# Patient Record
Sex: Female | Born: 1965 | Race: Black or African American | Hispanic: No | Marital: Married | Smoking: Never smoker
Health system: Southern US, Community
[De-identification: ages and names within clinical notes are randomized; demographics above are authoritative.]

## PROBLEM LIST (undated history)

## (undated) DIAGNOSIS — I10 Essential (primary) hypertension: Secondary | ICD-10-CM

## (undated) DIAGNOSIS — J45909 Unspecified asthma, uncomplicated: Secondary | ICD-10-CM

## (undated) DIAGNOSIS — R011 Cardiac murmur, unspecified: Secondary | ICD-10-CM

## (undated) DIAGNOSIS — E119 Type 2 diabetes mellitus without complications: Secondary | ICD-10-CM

## (undated) DIAGNOSIS — D649 Anemia, unspecified: Secondary | ICD-10-CM

## (undated) DIAGNOSIS — E785 Hyperlipidemia, unspecified: Secondary | ICD-10-CM

## (undated) HISTORY — PX: TUBAL LIGATION: SHX77

## (undated) HISTORY — DX: Essential (primary) hypertension: I10

## (undated) HISTORY — DX: Hyperlipidemia, unspecified: E78.5

## (undated) HISTORY — PX: COLONOSCOPY W/ POLYPECTOMY: SHX1380

## (undated) HISTORY — DX: Type 2 diabetes mellitus without complications: E11.9

## (undated) HISTORY — PX: ANAL FISSURE REPAIR: SHX2312

---

## 2016-10-23 ENCOUNTER — Encounter: Payer: Self-pay | Admitting: *Deleted

## 2016-11-07 ENCOUNTER — Ambulatory Visit: Payer: Self-pay | Admitting: Registered"

## 2016-11-11 NOTE — Progress Notes (Signed)
Cardiology Office Note:    Date:  11/12/2016   ID:  Marisa Anderson, DOB [birth year], MRN [id number]  PCP:  No primary care provider on file.  Cardiologist:  Donato Schultz, MD    Referring MD: Gaynelle Adu, MD     History of Present Illness:    Marisa Anderson is a 51 y.o. female here for preoperative risk evaluation prior to bariatric surgery at the request of Dr. Gaynelle Adu of [facility].  Has a history of morbid obesity, hypertension, diabetes.  Her current BMI is a proximally 42. She denies any significant chest pain, mild shortness of breath with activity noted, no syncope, no bleeding. She remembers being told that she had a cardiac murmur in the past. She works at [employer].  Prior hemoglobin A1c was 7.4. [facility]. Arsenio Loader, NP.  She can easily traverse 2 flights of stairs without anginal symptoms. She is also able to walk a city block without difficulty. Therefore, she is able to complete greater than 4 METS of activity. No prior history of cardiovascular illness, no prior history of stroke. No early family history of CAD.   Past Medical History:  Diagnosis Date  . Diabetes mellitus without complication (HCC)   . Hyperlipidemia   . Hypertension     No past surgical history on file.  Current Medications: Current Meds  Medication Sig  . atorvastatin (LIPITOR) 10 MG tablet Take 10 mg by mouth daily.  . Canagliflozin-Metformin HCl ER (INVOKAMET XR) 150-500 MG TB24 Take by mouth.  . metoprolol (LOPRESSOR) 50 MG tablet Take 50 mg by mouth 2 (two) times daily.  . pioglitazone (ACTOS) 45 MG tablet Take 45 mg by mouth daily.  . valsartan-hydrochlorothiazide (DIOVAN-HCT) 320-25 MG tablet Take 1 tablet by mouth daily.  . [DISCONTINUED] Liraglutide -Weight Management (SAXENDA) 18 MG/3ML SOPN Inject into the skin.     Allergies:   Patient has no known allergies.   Social History   Social History  . Marital status: Married   Spouse name: N/A  . Number of children: N/A  . Years of education: N/A   Social History Main Topics  . Smoking status: Never Smoker  . Smokeless tobacco: Never Used  . Alcohol use Yes     Comment: OCCASIONALLY  . Drug use: No  . Sexual activity: Not on file   Other Topics Concern  . Not on file   Social History Narrative  . No narrative on file     Family History: The patient's family history includes Breast cancer in her other; Diabetes in her father and sister; Hypertension in her brother, daughter, father, mother, and sister; Kidney disease in her father; Thyroid disease in her sister. ROS:   Please see the history of present illness.     All other systems reviewed and are negative.  EKGs/Labs/Other Studies Reviewed:    The following studies were reviewed today: Prior office notes reviewed, EKG reviewed, lab work reviewed  EKG:  EKG is ordered today.  The ekg ordered today demonstrates normal sinus rhythm 73 poor R-wave progression personally viewed, nonspecific T-wave flattening.  Recent Labs: No results found for requested labs within last 8760 hours.   Recent Lipid Panel No results found for: CHOL, TRIG, HDL, CHOLHDL, VLDL, LDLCALC, LDLDIRECT  Physical Exam:    VS:  BP 132/70   Pulse 73   Ht 5\' 4"  (1.626 m)   Wt 256 lb 6 oz (116.3 kg)   SpO2 98%   BMI  44.01 kg/m     Wt Readings from Last 3 Encounters:  11/12/16 256 lb 6 oz (116.3 kg)     GEN:  Well nourished, well developed in no acute distress HEENT: Normal NECK: No JVD; No carotid bruits LYMPHATICS: No lymphadenopathy CARDIAC: RRR, 1/6 systolic murmur right upper sternal border, no rubs, gallops RESPIRATORY:  Clear to auscultation without rales, wheezing or rhonchi  ABDOMEN: Soft, non-tender, non-distended, obese MUSCULOSKELETAL:  No edema; No deformity  SKIN: Warm and dry NEUROLOGIC:  Alert and oriented x 3 PSYCHIATRIC:  Normal affect   ASSESSMENT:    1. Murmur, cardiac   2. Preoperative  cardiovascular examination   3. Morbid obesity (HCC)   4. Diabetes mellitus with coincident hypertension (HCC)    PLAN:    In order of problems listed above:  Preoperative risk stratification prior to gastric bypass surgery  - She has risk factors of hypertension, hyperlipidemia, diabetes. She is not complaining of any anginal symptoms. She is able to complete greater than 4 METS of activity without difficulty. Based upon [facility] guidelines, she may proceed with intermediate risk surgery without any further testing.  Heart murmur  - 1/6 systolic murmur heard at right upper sternal border. Possible flow murmur. We will check echocardiogram. If something drastically changes, we will let Dr. Andrey CampanileWilson know.  Diabetes with hypertension  -Per primary team.. Doing well.[place]  Morbid obesity  - Wishes to pursue bariatric surgery. She will first go to the [place] then [place] in October. After this, she will hopefully undergo surgery. She has been battling with weight for quite some time.   Medication Adjustments/Labs and Tests Ordered: Current medicines are reviewed at length with the patient today.  Concerns regarding medicines are outlined above. Labs and tests ordered and medication changes are outlined in the patient instructions below:  Patient Instructions  Medication Instructions:  The current medical regimen is effective;  continue present plan and medications.  Testing/Procedures: Your physician has requested that you have an echocardiogram. Echocardiography is a painless test that uses sound waves to create images of your heart. It provides your doctor with information about the size and shape of your heart and how well your heart's chambers and valves are working. This procedure takes approximately one hour. There are no restrictions for this procedure.  Follow-Up: Follow up as needed with Dr Anne FuSkains.  You have been cleared for  surgery.  Thank you for choosing [facility]!!        Signed, Donato SchultzMark Delmont Prosch, MD  11/12/2016 11:17 AM    [facility]

## 2016-11-12 ENCOUNTER — Encounter (INDEPENDENT_AMBULATORY_CARE_PROVIDER_SITE_OTHER): Payer: Self-pay

## 2016-11-12 ENCOUNTER — Ambulatory Visit (INDEPENDENT_AMBULATORY_CARE_PROVIDER_SITE_OTHER): Payer: BLUE CROSS/BLUE SHIELD | Admitting: Cardiology

## 2016-11-12 ENCOUNTER — Encounter: Payer: Self-pay | Admitting: *Deleted

## 2016-11-12 ENCOUNTER — Encounter: Payer: Self-pay | Admitting: Cardiology

## 2016-11-12 VITALS — BP 132/70 | HR 73 | Ht 64.0 in | Wt 256.4 lb

## 2016-11-12 DIAGNOSIS — Z01810 Encounter for preprocedural cardiovascular examination: Secondary | ICD-10-CM

## 2016-11-12 DIAGNOSIS — E119 Type 2 diabetes mellitus without complications: Secondary | ICD-10-CM | POA: Insufficient documentation

## 2016-11-12 DIAGNOSIS — R011 Cardiac murmur, unspecified: Secondary | ICD-10-CM | POA: Insufficient documentation

## 2016-11-12 DIAGNOSIS — I10 Essential (primary) hypertension: Secondary | ICD-10-CM | POA: Diagnosis not present

## 2016-11-12 NOTE — Patient Instructions (Signed)
Medication Instructions:  The current medical regimen is effective;  continue present plan and medications.  Testing/Procedures: Your physician has requested that you have an echocardiogram. Echocardiography is a painless test that uses sound waves to create images of your heart. It provides your doctor with information about the size and shape of your heart and how well your heart's chambers and valves are working. This procedure takes approximately one hour. There are no restrictions for this procedure.  Follow-Up: Follow up as needed with Dr Anne FuSkains.  You have been cleared for surgery.  Thank you for choosing [facility]!!

## 2016-11-21 ENCOUNTER — Encounter: Payer: Self-pay | Admitting: Registered"

## 2016-11-21 ENCOUNTER — Encounter: Payer: BLUE CROSS/BLUE SHIELD | Attending: General Surgery | Admitting: Registered"

## 2016-11-21 DIAGNOSIS — Z713 Dietary counseling and surveillance: Secondary | ICD-10-CM | POA: Insufficient documentation

## 2016-11-21 DIAGNOSIS — E119 Type 2 diabetes mellitus without complications: Secondary | ICD-10-CM

## 2016-11-21 NOTE — Progress Notes (Signed)
Pre-Op Assessment Visit:  Pre-Operative RYGB Surgery  Medical Nutrition Therapy:  Appt start time: 10:45  End time:  11:42  Patient was seen on 11/21/2016 for Pre-Operative Nutrition Assessment. Assessment and letter of approval faxed to [facility] coordinator on 11/21/2016.   Pt expectation of surgery: rid of type 2 diabetes, to get healthy  Pt expectation of Dietitian: guidance on what needs to be done to be healthy  Start weight at NDES: 257.9 BMI: 43.58   Pt states she will probably have surgery after Oct 2018. Pt states she eats a lot of fast food and she wokrs 12 hrs shifts. Pt states she checks her blood sugar 2x/day about 5 days/week: FBS (70-80) after meals (180). Pt states she's had diabetes about 16 yrs. Pt states her last  A1c (7.4) about 5 months ago and has to go every 6 months to have it checked.   Per insurance, pt needs 6 SWL visits prior to surgery.   24 hr Dietary Recall: First Meal: cereal  Snack: grapes Second Meal: [employer]ee[employer]-hot dog, fries Snack: apple, chips Third Meal: [employer]ee[employer]-Burger, chips  Snack: sometimes fruit Beverages: sweet tea, water, soda, juice  Encouraged to engage in 150 minutes of moderate physical activity including cardiovascular and weight baring weekly  Handouts given during visit include:  . Pre-Op Goals . Bariatric Surgery Protein Shakes . Vitamin and Mineral Recommendations  During the appointment today the following Pre-Op Goals were reviewed with the patient: . Maintain or lose weight as instructed by your surgeon . Make healthy food choices . Begin to limit portion sizes . Limited concentrated sugars and fried foods . Keep fat/sugar in the single digits per serving on          food labels . Practice CHEWING your food  (aim for 30 chews per bite or until applesauce consistency) . Practice not drinking 15 minutes before, during, and 30 minutes after each meal/snack . Avoid all carbonated  beverages  . Avoid/limit caffeinated beverages  . Avoid all sugar-sweetened beverages . Consume 3 meals per day; eat every 3-5 hours . Make a list of non-food related activities . Aim for 64-100 ounces of FLUID daily  . Aim for at least 60-80 grams of PROTEIN daily . Look for a liquid protein source that contain ?15 g protein and ?5 g carbohydrate  (ex: shakes, drinks, shots) . Physical activity is an important part of a healthy lifestyle so keep it moving!  Goals: - Add in protein source during snack time with fruit. - Check blood sugar 3-4 times per day and track numbers. Fasting (80-130) and 2 hrs after meals (less than 180).   Follow diet recommendations listed below Energy and Macronutrient Recommendations: Calories: 1600 Carbohydrate: 180 Protein: 120 Fat: 44  Demonstrated degree of understanding via:  Teach Back   Teaching Method Utilized:  Visual Auditory Hands on  Barriers to learning/adherence to lifestyle change: work-life balance  Patient to call the Nutrition and Diabetes Education Services to enroll in Pre-Op and Post-Op Nutrition Education when surgery date is scheduled.

## 2016-11-21 NOTE — Patient Instructions (Addendum)
-   Add in protein source during snack time with fruit.  - Check blood sugar 3-4 times per day and track numbers. Fasting (80-130) and 2 hrs after meals (less than 180).

## 2016-11-27 ENCOUNTER — Other Ambulatory Visit: Payer: Self-pay

## 2016-11-27 ENCOUNTER — Ambulatory Visit (HOSPITAL_COMMUNITY): Payer: BLUE CROSS/BLUE SHIELD | Attending: Cardiovascular Disease

## 2016-11-27 DIAGNOSIS — R011 Cardiac murmur, unspecified: Secondary | ICD-10-CM | POA: Diagnosis not present

## 2016-11-27 DIAGNOSIS — I517 Cardiomegaly: Secondary | ICD-10-CM | POA: Diagnosis not present

## 2016-11-27 LAB — ECHOCARDIOGRAM COMPLETE
AVLVOTPG: 6 mmHg
Ao-asc: 32 cm
CHL CUP MV DEC (S): 239
CHL CUP STROKE VOLUME: 50 mL
CHL CUP TV REG PEAK VELOCITY: 276 cm/s
E decel time: 239 msec
E/e' ratio: 7.03
FS: 48 % — AB (ref 28–44)
IVS/LV PW RATIO, ED: 1.21
LA diam end sys: 36 mm
LA diam index: 1.64 cm/m2
LA vol A4C: 34 ml
LA vol index: 21.5 mL/m2
LA vol: 47 mL
LASIZE: 36 mm
LDCA: 3.14 cm2
LV TDI E'LATERAL: 12.5
LV sys vol index: 12 mL/m2
LV sys vol: 26 mL (ref 14–42)
LVDIAVOL: 76 mL (ref 46–106)
LVDIAVOLIN: 35 mL/m2
LVEEAVG: 7.03
LVEEMED: 7.03
LVELAT: 12.5 cm/s
LVOT SV: 87 mL
LVOT VTI: 27.7 cm
LVOT diameter: 20 mm
LVOTPV: 120 cm/s
Lateral S' vel: 16.1 cm/s
MV pk E vel: 87.9 m/s
MVPG: 3 mmHg
MVPKAVEL: 62.7 m/s
PW: 12 mm — AB (ref 0.6–1.1)
RV sys press: 33 mmHg
Simpson's disk: 66
TDI e' medial: 9.65
TR max vel: 276 cm/s

## 2016-11-28 ENCOUNTER — Other Ambulatory Visit (HOSPITAL_COMMUNITY): Payer: Self-pay

## 2016-11-29 ENCOUNTER — Telehealth: Payer: Self-pay | Admitting: Cardiology

## 2016-11-29 NOTE — Telephone Encounter (Signed)
Follow Up ° ° °Pt is calling following up on results. Please call. °

## 2016-11-29 NOTE — Telephone Encounter (Signed)
Lm to cb for results

## 2016-12-03 NOTE — Telephone Encounter (Signed)
Reviewed result of echo with patient who thanked me for the call.

## 2016-12-19 ENCOUNTER — Encounter: Payer: BLUE CROSS/BLUE SHIELD | Attending: General Surgery | Admitting: Registered"

## 2016-12-19 ENCOUNTER — Encounter: Payer: Self-pay | Admitting: Registered"

## 2016-12-19 DIAGNOSIS — E119 Type 2 diabetes mellitus without complications: Secondary | ICD-10-CM

## 2016-12-19 DIAGNOSIS — Z713 Dietary counseling and surveillance: Secondary | ICD-10-CM | POA: Diagnosis not present

## 2016-12-19 NOTE — Patient Instructions (Addendum)
-   Check 3-4 times per day: fasting blood sugar when you initially wake up and 2 hours after meals.   - Eat snack or meal to maintain blood sugar after recovering from low blood sugar episode.  - Add in meal during work shift at night.   - Continue to snack at work. Pair fruit with protein source: apple or banana with peanut butter or protein shake  - Make healthier choices choosing baked, grilled, steamed, broiled options.

## 2016-12-19 NOTE — Progress Notes (Signed)
Appt start time: 9:50 end time: 10:17  Assessment: 1st SWL Appointment.   Start Wt at NDES: 257.9 Wt: 257.3 BMI: 43.48   Pt is talkative. Pt arrives having maintained weight from previous visit. Pt states she has reduced soda intake but still drinks 1 at work sometimes. Pt states her BS drops to 80-90 and feels bad. Pt states she doesn't experience typical signs and symptoms of hypoglycemia; only "feels funny". Pt states she works 7pm-7am and doesn't eat a meal while working due to it being really hot at work. Pt states she doesn't check BS every day; checks 10-12x/wk. Pt states FBS (70-80). RD educated pt on importance of checking BS multiple times/day. Pt reports she will be going to [place] in Aug with family/friends. Pt states daughter is vegetarian and prepares meals for her at times to help out.    MEDICATIONS: See list   DIETARY INTAKE:  24-hr recall:  B ( AM): fried chicken, green beans, corn or [employer]-1/2 wrap  Snk ( AM): nabs, fruit, chips (sometimes)  L ( PM): 1/2 wrap  Snk ( PM): none D ( PM): Bojnagles-chicken supremes, sm amt of fries or scrambled eggs, 3 pieces of bacon, bun or grilled [place]aw[place]key wing, beef link sausage, veggie baked beans Snk ( PM): non Beverages: sweet tea  Usual physical activity: none  Diet to Follow: Calories: 1600 kcals Carbohydrate: 180g Protein: 120g Fat: 44g   Preferred Learning Style:   No preference indicated   Learning Readiness:   Contemplating  Ready  Change in progress     Nutritional Diagnosis:  [place]-3.3 Overweight/obesity related to past poor dietary habits and physical inactivity as evidenced by patient w/ planned RYGB surgery following dietary guidelines for continued weight loss.    Intervention:  Nutrition counseling for upcoming Bariatric Surgery.  Goals:  - Check 3-4 times per day: fasting blood sugar when you initially wake up and 2 hours after meals.  - Eat snack or meal to maintain blood sugar after  recovering from low blood sugar episode. - Add in meal during work shift at night.  - Continue to snack at work. Pair fruit with protein source: apple or banana with peanut butter or protein shake - Make healthier choices choosing baked, grilled, steamed, broiled options.  Teaching Method Utilized:  Visual Auditory Hands on  Handouts given during visit include:  Bake, Broil, Grill  Vitamin and Mineral Recommendations  Barriers to learning/adherence to lifestyle change: work-life balance  Demonstrated degree of understanding via:  Teach Back   Monitoring/Evaluation:  Dietary intake, exercise, and body weight in 1 month(s).

## 2017-01-16 ENCOUNTER — Encounter: Payer: BLUE CROSS/BLUE SHIELD | Attending: General Surgery | Admitting: Registered"

## 2017-01-16 DIAGNOSIS — E119 Type 2 diabetes mellitus without complications: Secondary | ICD-10-CM

## 2017-01-16 DIAGNOSIS — Z713 Dietary counseling and surveillance: Secondary | ICD-10-CM | POA: Diagnosis present

## 2017-01-16 NOTE — Patient Instructions (Addendum)
-   Increase non-starchy vegetables to daily routine such as broccoli, cabbage, spinach, collard greens, etc.   - Check blood sugar 3-4 times per day: fasting blood sugar when you initially wake up and 2 hours after meals.   - Add in meal during work shift at night such as peanut butter crackers, fresh vegetables (broccoli, carrots, peppers, tomatoes)  - Substitute flavor packs with water instead of drinking sweet tea and soda.   - Continue to work on chewing well; at least 30 times per bite or applesauce consistency.

## 2017-01-16 NOTE — Progress Notes (Signed)
Appt start time: 11:00 end time: 11:26  Assessment: 2nd SWL Appointment.   Start Wt at NDES: 257.9 Wt: 259.4 BMI: 44.1   Pt is talkative. Pt arrives having gained 2.1 lbs from previous visit. Pt states "something is going on with kidneys". Pt reports she is no longer taking HCT/fluid pills which is causing her to experience swelling in legs. Pt states she does not check BS every day; checks once/day. Pt states FBS (80-90). RD educated pt on importance of checking BS multiple times/day. Pt states she will be going to [place] with friends in a week.   Pt states her BS drops to 80-90 and feels bad. Pt states she doesn't experience typical signs and symptoms of hypoglycemia; only "feels funny". Pt states she works 7pm-7am and doesn't eat a meal while working due to it being really hot at work. Pt reports she will be going to [place] in Aug with family/friends. Pt states daughter is vegetarian and prepares meals for her at times to help out.     MEDICATIONS: See list   DIETARY INTAKE:  24-hr recall:  B ( AM): baked spaghetti, corn  Snk ( AM): grapes, cherries  L ( PM):  peanut butter nabs Snk ( PM): none  D ( PM): McDonalds-big mac combo  Snk ( PM): a few cheese doodles Beverages: sweet tea, water, mountain dew  Usual physical activity: none  Diet to Follow: Calories: 1600 kcals Carbohydrate: 180g Protein: 120g Fat: 44g   Preferred Learning Style:   No preference indicated   Learning Readiness:   Contemplating  Ready  Change in progress     Nutritional Diagnosis:  [place]-3.3 Overweight/obesity related to past poor dietary habits and physical inactivity as evidenced by patient w/ planned RYGB surgery following dietary guidelines for continued weight loss.    Intervention:  Nutrition counseling for upcoming Bariatric Surgery.  Goals:  - Check blood sugar 3-4 times per day: fasting blood sugar when you initially wake up and 2 hours after meals. (in  progress) - Eat snack or meal to maintain blood sugar after recovering from low blood sugar episode. - Add in meal during work shift at night. (in progress) - Continue to snack at work. Pair fruit with protein source: apple or banana with peanut butter or protein shake - Make healthier choices choosing baked, grilled, steamed, broiled options. (in progress) - Increase non-starchy vegetables to daily routine such as broccoli, cabbage, spinach, collard greens, etc. (in progress) - Add in meal during work shift at night such as peanut butter crackers, fresh vegetables (broccoli, carrots, peppers, tomatoes) - Substitute flavor packs with water instead of drinking sweet tea and soda.  - Continue to work on chewing well; at least 30 times per bite or applesauce consistency. (in progress)  Teaching Method Utilized:  Visual Auditory Hands on  Handouts given during visit include:  none  Barriers to learning/adherence to lifestyle change: work-life balance  Demonstrated degree of understanding via:  Teach Back   Monitoring/Evaluation:  Dietary intake, exercise, and body weight in 1 month(s).

## 2017-02-13 ENCOUNTER — Ambulatory Visit: Payer: BLUE CROSS/BLUE SHIELD | Admitting: Registered"

## 2017-02-17 ENCOUNTER — Encounter: Payer: Self-pay | Admitting: Registered"

## 2017-02-17 ENCOUNTER — Encounter: Payer: BLUE CROSS/BLUE SHIELD | Attending: General Surgery | Admitting: Registered"

## 2017-02-17 DIAGNOSIS — Z713 Dietary counseling and surveillance: Secondary | ICD-10-CM | POA: Diagnosis not present

## 2017-02-17 DIAGNOSIS — E119 Type 2 diabetes mellitus without complications: Secondary | ICD-10-CM

## 2017-02-17 NOTE — Patient Instructions (Addendum)
-   Aim to decrease soda intake to 2 sodas per week.   - Increase physical activity to at least 30 min/day, 3 days/week such as walking in neighborhood on non-work days.

## 2017-02-17 NOTE — Progress Notes (Signed)
Appt start time: 2:30 end time: 2:45  Assessment: 3rd SWL Appointment.   Start Wt at NDES: 257.9 Wt: 259.6 BMI: 43.87   Pt is talkative. Pt arrives having gained 2.1 lbs from previous visit. Pt states her recent A1c from last week was 7.0, decreasing from previous value of 7.4. Pt states she checks her BS 2x/day: FBS (111) and evenings (130). Pt states she has been working on chewing well, has reduced soda intake, and replacing with flavor packs in water. Pt states she is drinking about 4 sodas/week. Pt states she is traveling to [place] in Oct for work.    MEDICATIONS: See list   DIETARY INTAKE:  24-hr recall:  B ( AM): oatmeal, water   Snk ( AM): grapes, cherries  L ( PM): Mayflower-catfish, shrimp, baked potato  Snk ( PM): none  D ( PM): Mayflower-catfish, shrimp, baked potato  Snk ( PM): apple, pork skins Beverages: sweet tea, water, mountain dew  Usual physical activity: none  Diet to Follow: Calories: 1600 kcals Carbohydrate: 180g Protein: 120g Fat: 44g   Preferred Learning Style:   No preference indicated   Learning Readiness:   Contemplating  Ready  Change in progress     Nutritional Diagnosis:  [place]-3.3 Overweight/obesity related to past poor dietary habits and physical inactivity as evidenced by patient w/ planned RYGB surgery following dietary guidelines for continued weight loss.    Intervention:  Nutrition counseling for upcoming Bariatric Surgery.  Goals:  - Aim to decrease soda intake to 2 sodas per week.  - Increase physical activity to at least 30 min/day, 3 days/week such as walking in neighborhood on non-work  days.   Teaching Method Utilized:  Visual Auditory Hands on  Handouts given during visit include:  none  Barriers to learning/adherence to lifestyle change: work-life balance  Demonstrated degree of understanding via:  Teach Back   Monitoring/Evaluation:  Dietary intake, exercise, and body weight in 1 month(s).

## 2017-03-18 ENCOUNTER — Encounter: Payer: Self-pay | Admitting: Registered"

## 2017-03-18 ENCOUNTER — Encounter: Payer: BLUE CROSS/BLUE SHIELD | Attending: General Surgery | Admitting: Registered"

## 2017-03-18 DIAGNOSIS — E119 Type 2 diabetes mellitus without complications: Secondary | ICD-10-CM

## 2017-03-18 DIAGNOSIS — Z713 Dietary counseling and surveillance: Secondary | ICD-10-CM | POA: Diagnosis present

## 2017-03-18 NOTE — Progress Notes (Signed)
Appt start time: 11:20 end time: 11:35  Assessment: 4th SWL Appointment.   Start Wt at NDES: 257.9 Wt: 256.7 BMI: 43.42   Pt is talkative. Pt arrives having lost 2.9 lbs from previous visit. Pt states she has reduced soda intake from 4 sodas/week. Pt reports she started off doing well with eliminating sodas for 2 consecutive weeks and then started drinking them again, about 2-3 sodas/week. Pt states Pt states her recent A1c from a month ago was 7.0, a decrease from previous value of 7.4. Pt states she checks her BS 2x/day: FBS (90s) and evenings (130-140). Pt states she does a lot of walking at work. Pt reports she will be traveling to [place] next month for work (10/14-10/18). Pt states she still drinks sweet tea sometimes from [employer].    MEDICATIONS: See list   DIETARY INTAKE:  24-hr recall:  B ( AM): McDonalds- oatmeal, water   Snk ( AM): sometimes grapes, cherries  L ( PM): spam sandwich on wheat bread Snk ( PM): none  D ( PM): baked chicken, cabbage  Snk ( PM): sometimes apple, pork skins Beverages: sweet tea (sometimes), water, mountain dew (sometimes)  Usual physical activity: walking at work  Diet to Follow: Calories: 1600 kcals Carbohydrate: 180g Protein: 120g Fat: 44g   Preferred Learning Style:   No preference indicated   Learning Readiness:   Contemplating  Ready  Change in progress     Nutritional Diagnosis:  [place]-3.3 Overweight/obesity related to past poor dietary habits and physical inactivity as evidenced by patient w/ planned RYGB surgery following dietary guidelines for continued weight loss.    Intervention:  Nutrition counseling for upcoming Bariatric Surgery.  Goals:  - Try MIO drops from [employer], [employer], or [facility]; sweet tea flavor as sweet tea alternative.  - Aim to increase physical activity to at least 30 min/day, 3 days/week such as walking in neighborhood on non-work days.  - Aim to increase non-starchy vegetables such as  cucumbers, tomatoes, peppers, carrots, celery, etc. Mixture of tomatoes, cucumbers, onions and vinegar is a great option. - Aim to decrease soda intake to 2 sodas per week.   - Get Vitamin D.  Teaching Method Utilized:  Visual Auditory Hands on  Handouts given during visit include:  none  Barriers to learning/adherence to lifestyle change: work-life balance  Demonstrated degree of understanding via:  Teach Back   Monitoring/Evaluation:  Dietary intake, exercise, and body weight in 1 month(s).

## 2017-03-18 NOTE — Patient Instructions (Addendum)
-   Try MIO drops from [employer], [employer], or [employer]; sweet tea flavor as sweet tea alternative.   - Aim to increase physical activity to at least 30 min/day, 3 days/week such as walking in neighborhood on non-work days.   - Aim to increase non-starchy vegetables such as cucumbers, tomatoes, peppers, carrots, celery, etc. Mixture of tomatoes, cucumbers, onions and vinegar is a great option.  - Aim to decrease soda intake to 2 sodas per week.    - Get Vitamin D.

## 2017-03-21 ENCOUNTER — Other Ambulatory Visit (HOSPITAL_COMMUNITY): Payer: Self-pay | Admitting: General Surgery

## 2017-04-09 ENCOUNTER — Ambulatory Visit (HOSPITAL_COMMUNITY): Payer: BLUE CROSS/BLUE SHIELD

## 2017-04-09 ENCOUNTER — Encounter (HOSPITAL_COMMUNITY): Payer: Self-pay

## 2017-04-18 ENCOUNTER — Ambulatory Visit: Payer: BLUE CROSS/BLUE SHIELD | Admitting: Registered"

## 2017-04-18 ENCOUNTER — Encounter: Payer: BLUE CROSS/BLUE SHIELD | Admitting: Registered"

## 2017-04-21 ENCOUNTER — Encounter: Payer: Self-pay | Admitting: Registered"

## 2017-04-21 ENCOUNTER — Encounter: Payer: BLUE CROSS/BLUE SHIELD | Attending: General Surgery | Admitting: Registered"

## 2017-04-21 DIAGNOSIS — E119 Type 2 diabetes mellitus without complications: Secondary | ICD-10-CM

## 2017-04-21 DIAGNOSIS — Z713 Dietary counseling and surveillance: Secondary | ICD-10-CM | POA: Diagnosis not present

## 2017-04-21 NOTE — Progress Notes (Signed)
Appt start time: 9:06 end time: 9:24  Assessment: 5th SWL Appointment.   Start Wt at [facility]: 257.9 Wt: 252.3 BMI: 42.64   Pt is talkative. Pt arrives having lost 4.4 lbs from previous visit. Pt states she did a lot of walking during her trip to [place], but has been unable to establish a physical activity routine on her non-work days. Pt states she does not have surgery date yet. Pt states she checks her BS 2x/day: FBS (80) and evenings (128). Pt states she substitutes sweet tea consumption with water and flavor packs. Pt states she has been more intentional with eating more throughout her day, getting at least 3 meals a day. Pt states she wants to have surgery towards the end of November or beginning of December.    MEDICATIONS: See list   DIETARY INTAKE:  24-hr recall:  B ( AM): [employer]-oatmeal, water   Snk ( AM): sometimes grapes, cherries  L ( PM): taco salad Snk ( PM): none  D ( PM): meatloaf, mashed potatoes, green beans Snk ( PM): sometimes apple, pork skins Beverages: sweet tea (sometimes), water, mountain dew (sometimes)  Usual physical activity: walking at work  Diet to Follow: Calories: 1600 kcals Carbohydrate: 180g Protein: 120g Fat: 44g   Preferred Learning Style:   No preference indicated   Learning Readiness:   Contemplating  Ready  Change in progress     Nutritional Diagnosis:  [place]-3.3 Overweight/obesity related to past poor dietary habits and physical inactivity as evidenced by patient w/ planned RYGB surgery following dietary guidelines for continued weight loss.    Intervention:  Nutrition counseling for upcoming Bariatric Surgery.  Goals:  - Aim to walk in neighborhood 20 min, 2 days a week on off-days.  - Add protein options such eggs or Malaw[place]urkey sausage with oatmeal as breakfast.  - Get Vitamin D.  Teaching Method Utilized:  Visual Auditory Hands on  Handouts given during visit include:  none  Barriers to learning/adherence to  lifestyle change: work-life balance  Demonstrated degree of understanding via:  Teach Back   Monitoring/Evaluation:  Dietary intake, exercise, and body weight in 1 month(s).

## 2017-04-21 NOTE — Patient Instructions (Addendum)
-   Aim to walk in neighborhood 20 min, 2 days a week on off-days.   - Add protein options such eggs or Malawiturkey sausage with oatmeal as breakfast.   - Get Vitamin D.

## 2017-05-13 ENCOUNTER — Ambulatory Visit (HOSPITAL_COMMUNITY)
Admission: RE | Admit: 2017-05-13 | Discharge: 2017-05-13 | Disposition: A | Discharge status: Home / Self Care | Payer: BLUE CROSS/BLUE SHIELD | Source: Ambulatory Visit | Attending: General Surgery | Admitting: General Surgery

## 2017-05-13 ENCOUNTER — Other Ambulatory Visit: Payer: Self-pay

## 2017-05-13 DIAGNOSIS — Z01810 Encounter for preprocedural cardiovascular examination: Secondary | ICD-10-CM | POA: Insufficient documentation

## 2017-05-13 DIAGNOSIS — R9431 Abnormal electrocardiogram [ECG] [EKG]: Secondary | ICD-10-CM | POA: Diagnosis not present

## 2017-05-13 DIAGNOSIS — K219 Gastro-esophageal reflux disease without esophagitis: Secondary | ICD-10-CM | POA: Insufficient documentation

## 2017-05-19 ENCOUNTER — Encounter: Payer: Self-pay | Admitting: Skilled Nursing Facility1

## 2017-05-19 ENCOUNTER — Encounter: Payer: BLUE CROSS/BLUE SHIELD | Attending: General Surgery | Admitting: Skilled Nursing Facility1

## 2017-05-19 DIAGNOSIS — Z713 Dietary counseling and surveillance: Secondary | ICD-10-CM | POA: Insufficient documentation

## 2017-05-19 DIAGNOSIS — I10 Essential (primary) hypertension: Secondary | ICD-10-CM

## 2017-05-19 DIAGNOSIS — E119 Type 2 diabetes mellitus without complications: Secondary | ICD-10-CM

## 2017-05-19 NOTE — Patient Instructions (Signed)
-  Try the nuts in your oatmeal

## 2017-05-19 NOTE — Progress Notes (Signed)
Appt start time: 9:06 end time: 9:24  Assessment: 5th SWL Appointment.   Start Wt at [facility]: 257.9 Wt: 257.3 BMI: 43.48  Pt arrives having gained about 5 pounds. Pt states her A1C is 6.9. Pt states she checks her BS 2x/day: FBS (80) and evenings (128). Pt states she has not been working on the items discusses in her last apportionment. Pt states the changes have been hard due to her 12 hour shifts.    MEDICATIONS: See list   DIETARY INTAKE:  24-hr recall:  B ( AM): 3 strips and bacon 2 eggs and cheese and 1 wheat toast Snk ( AM): chips L ( PM): skipped Snk ( PM): none  D ( PM): hardees chicken  Mashed potatoes and green beans Snk ( PM): sometimes apple, pork skins Beverages: sweet tea (sometimes), water, mountain dew (sometimes)  Usual physical activity: walking at work  Diet to Follow: Calories: 1600 kcals Carbohydrate: 180g Protein: 120g Fat: 44g   Preferred Learning Style:   No preference indicated   Learning Readiness:   Contemplating  Ready  Change in progress    Nutritional Diagnosis:  [place]-3.3 Overweight/obesity related to past poor dietary habits and physical inactivity as evidenced by patient w/ planned RYGB surgery following dietary guidelines for continued weight loss.    Intervention:  Nutrition counseling for upcoming Bariatric Surgery.  Goals:  - Aim to walk in neighborhood 20 min, 2 days a week on off-days.  - Add protein options such eggs or Malawiturkey sausage with oatmeal as breakfast.  - Get Vitamin D.  Teaching Method Utilized:  Visual Auditory Hands on  Handouts given during visit include:  none  Barriers to learning/adherence to lifestyle change: work-life balance  Demonstrated degree of understanding via:  Teach Back   Monitoring/Evaluation:  Dietary intake, exercise, and body weight in 1 month(s).

## 2017-06-16 ENCOUNTER — Encounter: Payer: BLUE CROSS/BLUE SHIELD | Attending: General Surgery | Admitting: Registered"

## 2017-06-16 DIAGNOSIS — Z713 Dietary counseling and surveillance: Secondary | ICD-10-CM | POA: Insufficient documentation

## 2017-06-16 DIAGNOSIS — E119 Type 2 diabetes mellitus without complications: Secondary | ICD-10-CM

## 2017-06-16 NOTE — Progress Notes (Signed)
  Pre-Operative Nutrition Class:  Appt start time: 8:15   End time:  9:15.  Patient was seen on 06/16/2017 for Pre-Operative Bariatric Surgery Education at [facility].   Surgery date: TBD Surgery type: RYGB Start weight at [facility]: 257.9 Weight today: 254.5   Samples given per MNT protocol. Patient educated on appropriate usage: Bariatric Advantage Multivitamin Lot # [id number] Exp: 12/2017  Bariatric Advantage Calcium Citrate Lot # [id number] Exp:04/03/2018  Bariatric Advantage Calcium Citrate Lot # [id number] Exp: 03/13/2018  Unjury Protein Powder Lot # [id number] Exp: 11/23/2017   The following the learning objectives were met by the patient during this course:  Identify Pre-Op Dietary Goals and will begin 2 weeks pre-operatively  Identify appropriate sources of fluids and proteins   State protein recommendations and appropriate sources pre and post-operatively  Identify Post-Operative Dietary Goals and will follow for 2 weeks post-operatively  Identify appropriate multivitamin and calcium sources  Describe the need for physical activity post-operatively and will follow MD recommendations  State when to call healthcare provider regarding medication questions or post-operative complications  Handouts given during class include:  Pre-Op Bariatric Surgery Diet Handout  Protein Shake Handout  Post-Op Bariatric Surgery Nutrition Handout  BELT Program Information Flyer  Support Group Information Flyer  [facility] Bariatric Supplements Price List  Follow-Up Plan: Patient will follow-up at [facility] 2 weeks post operatively for diet advancement per MD.

## 2017-11-13 ENCOUNTER — Ambulatory Visit: Payer: Self-pay | Admitting: General Surgery

## 2017-11-13 NOTE — H&P (Signed)
[facility] Documented: 11/11/2017 3:21 PM Location: [facility] Patient #: [id number] DOB: 09/15/1965 Married / Language: English / Race: Black or African American Female  History of Present Illness ([facility] M. Marisa Hehl MD; 11/13/2017 8:24 PM) The patient is a 51 year old female who presents for a bariatric surgery evaluation. She comes in today for up to date preoperative consultation. I initially saw her in April 2018 but again most recently in March 2019. Her weight in 2018 was 256 pounds. She states that she is doing well. She denies any significant medical changes since I saw her 2 mo ago. She has completed 6 months of supervised weight loss with our medical dietitians. She has been evaluated and felt safe for surgery by psychology. Her upper GI was unremarkable except for some mild reflux. Her chest x-ray was unremarkable. She saw cardiology last spring for evaluation for clearance for surgery. She was felt to be low risk.  She had blood work done on September 25, 2017. She had some mild anemia with a hemoglobin of 10.5 and a hematocrit of 35.9. MCV was low at 75.9. Platelet count normal. She had a normal comprehensive metabolic panel with normal BUN and creatinine. Her alkaline phosphatase is slightly elevated at 123. Her hemoglobin A1c was 7.5. Total cholesterol 127, triglycerides, 73; HDL 52; LDL 60. Iron was high at 189, transferrin normal at 3 of 3, TIBC 424. Transferrin saturation 45%. Vitamin B1 normal. Vitamin B12 normal. Calcitrol normal at 67; vitamin D 25-hydroxy low at 10. TSH normal  She denies any chest pain, chest pressure, shortness of breath, orthopnea, paroxysmal nocturnal dyspnea, peripheral edema or personal or family history of blood clots.  09/2016 She is referred by [facility] at [facility] in [place] for evaluation of weight loss surgery. She attended our seminar in person in [facility]. She is interested in a gastric bypass.  One of her main goals is to improve her diabetes and to get off other medications. Despite numerous attempts for sustained weight loss she has been unsuccessful. She has tried Weight Watchers, meal replacement shakes like glucerna, and currently on saxenda.  Her comorbidities include hypertension, dyslipidemia, diabetes mellitus type 2  She denies any chest pain, chest pressure, shortness of breath, orthopnea, paroxysmal nocturnal dyspnea, peripheral edema or personal or family history of blood clots. She states that she was told she had a cardiac murmur at one point. She has very rare reflux. She has a daily bowel movement. She denies any melena or hematochezia. She had a colonoscopy in March 2017 which removed a benign polyp. She is a G2 P2. She's had a tubal ligation. She denies any dysuria or hematuria. She denies any specific joint pain. She has been a diabetic for a few years. She is just on oral agents. She states that her last hemoglobin A1c was around 7.4. She denies any TIAs or amaurosis fugax. She denies any tobacco or drug use. She drinks alcohol on a very rare occasion. She works at the [facility]   Problem List/Past Medical ([facility] M. Maxine Fredman, MD; 11/13/2017 8:26 PM) HEART MURMUR (R01.1) FATIGUE (R53.83) VITAMIN D DEFICIENCY (E55.9) MILD ANEMIA (D64.9) CLASS 3 SEVERE OBESITY DUE TO EXCESS CALORIES WITH SERIOUS COMORBIDITY AND BODY MASS INDEX (BMI) OF 40.0 TO 44.9 IN ADULT (E66.01) DYSLIPIDEMIA (E78.5)  Diagnostic Studies History ([facility]isa [facility] M. Marisa Lamagna, MD; 11/13/2017 8:26 PM) Colonoscopy 1-5 years ago Mammogram within last year Pap Smear 1-5 years ago  Allergies ([facility]isa [facility], [name]; 11/11/2017   3:22 PM) No Known Drug Allergies [09/17/2017]: Allergies Reconciled  Medication History ([facility]isa [facility], [name]; 11/11/2017 3:24 PM) Amlodipine-Atorvastatin (10-40MG Tablet, Oral) Active. Valsartan-Hydrochlorothiazide (320-25MG Tablet, Oral)  Active. Atorvastatin Calcium (10MG Tablet, Oral) Active. Invokamet XR (150-500MG Tablet ER 24HR, Oral) Active. Metoprolol Tartrate (50MG Tablet, Oral) Active. Pioglitazone HCl (45MG Tablet, Oral) Active. Albuterol Sulfate ((2.5 MG/3ML)0.083% Nebulized Soln, Inhalation) Active. Vitamin D (50000U Tablet, Oral) Active. Ferrous Sulfate (325 (65 Fe)MG Tablet, Oral) Active. Medications Reconciled  Social History ([facility] M. Marisa Archibald, MD; 11/13/2017 8:26 PM) Alcohol use Occasional alcohol use. Caffeine use Carbonated beverages, Tea. No drug use Tobacco use Never smoker.  Family History ([facility]isa [facility] M. Marisa Clouatre, MD; 11/13/2017 8:26 PM) Breast Cancer Family Members In General. Diabetes Mellitus Father, Sister. Hypertension Brother, Daughter, Father, Mother, Sister. Kidney Disease Father. Thyroid problems Sister.  Pregnancy / Birth History ([facility]isa [facility] M. Marisa Mader, MD; 11/13/2017 8:26 PM) Age at menarche 11 years. Gravida 3 Maternal age 21-25 Para 2 Regular periods  Other Problems ([facility] M. Marisa Harpenau, MD; 11/13/2017 8:26 PM) DIABETES MELLITUS TYPE 2 IN OBESE (E11.69) HYPERTENSION, ESSENTIAL (I10) HYPERCHOLESTEREMIA (E78.00)     Review of Systems ([facility] M. Marisa Ratz MD; 11/13/2017 8:20 PM) General Not Present- Appetite Loss, Chills, Fatigue, Fever, Night Sweats, Weight Gain and Weight Loss. Skin Not Present- Change in Wart/Mole, Dryness, Hives, Jaundice, New Lesions, Non-Healing Wounds, Rash and Ulcer. HEENT Not Present- Earache, Hearing Loss, Hoarseness, Nose Bleed, Oral Ulcers, Ringing in the Ears, Seasonal Allergies, Sinus Pain, Sore Throat, Visual Disturbances, Wears glasses/contact lenses and Yellow Eyes. Respiratory Not Present- Bloody sputum, Chronic Cough, Difficulty Breathing, Snoring and Wheezing. Breast Not Present- Breast Mass, Breast Pain, Nipple Discharge and Skin Changes. Cardiovascular Not Present- Chest Pain, Difficulty Breathing Lying Down, Leg Cramps, Palpitations, Rapid  Heart Rate, Shortness of Breath and Swelling of Extremities. Gastrointestinal Not Present- Abdominal Pain, Bloating, Bloody Stool, Change in Bowel Habits, Chronic diarrhea, Constipation, Difficulty Swallowing, Excessive gas, Gets full quickly at meals, Hemorrhoids, Indigestion, Nausea, Rectal Pain and Vomiting. Female Genitourinary Not Present- Frequency, Nocturia, Painful Urination, Pelvic Pain and Urgency. Musculoskeletal Not Present- Back Pain, Joint Pain, Joint Stiffness, Muscle Pain, Muscle Weakness and Swelling of Extremities. Neurological Not Present- Decreased Memory, Fainting, Headaches, Numbness, Seizures, Tingling, Tremor, Trouble walking and Weakness. Psychiatric Not Present- Anxiety, Bipolar, Change in Sleep Pattern, Depression, Fearful and Frequent crying. Endocrine Not Present- Cold Intolerance, Excessive Hunger, Hair Changes, Heat Intolerance, Hot flashes and New Diabetes. Hematology Not Present- Blood Thinners, Easy Bruising, Excessive bleeding, Gland problems, HIV and Persistent Infections.  Vitals ([facility] [name]; 11/11/2017 3:22 PM) 11/11/2017 3:22 PM Weight: 257.2 lb Height: 65in Body Surface Area: 2.2 m Body Mass Index: 42.8 kg/m  Temp.: 98.4F(Temporal)  Pulse: 102 (Regular)  BP: 138/88 (Sitting, Left Arm, Standard)      Physical Exam (Karime Scheuermann M. Deauna Yaw MD; 11/13/2017 8:20 PM)  General Mental Status-Alert. General Appearance-Consistent with stated age. Hydration-Well hydrated. Voice-Normal. Note: morbidly obese  Head and Neck Head-normocephalic, atraumatic with no lesions or palpable masses. Trachea-midline. Thyroid Gland Characteristics - normal size and consistency.  Eye Eyeball - Bilateral-Extraocular movements intact. Sclera/Conjunctiva - Bilateral-No scleral icterus.  ENMT Ears Pinna - Bilateral - Note: normal external ears. Mouth and Throat -Note:normal lips.   Chest and Lung Exam Chest and lung exam reveals  -quiet, even and easy respiratory effort with no use of accessory muscles and on auscultation, normal breath sounds, no adventitious sounds and normal vocal resonance. Inspection Chest Wall - Normal. Back - normal.  Breast - Did not examine.  Cardiovascular Cardiovascular examination   reveals -normal pedal pulses bilaterally. Note:mild SEMurmur  Abdomen Inspection Inspection of the abdomen reveals - No Hernias. Skin - Scar - no surgical scars. Palpation/Percussion Palpation and Percussion of the abdomen reveal - Soft, Non Tender, No Rebound tenderness, No Rigidity (guarding) and No hepatosplenomegaly. Auscultation Auscultation of the abdomen reveals - Bowel sounds normal.  Peripheral Vascular Upper Extremity Palpation - Pulses bilaterally normal.  Neurologic Neurologic evaluation reveals -alert and oriented x 3 with no impairment of recent or remote memory. Mental Status-Normal.  Neuropsychiatric The patient's mood and affect are described as -normal. Judgment and Insight-insight is appropriate concerning matters relevant to self.  Musculoskeletal Normal Exam - Left-Upper Extremity Strength Normal and Lower Extremity Strength Normal. Normal Exam - Right-Upper Extremity Strength Normal and Lower Extremity Strength Normal. Note: bilateral knee crepitus  Lymphatic Head & Neck  General Head & Neck Lymphatics: Bilateral - Description - Normal. Axillary - Did not examine. Femoral & Inguinal - Did not examine.    Assessment & Plan Minerva Areola M. Shakeria Robinette MD; 11/13/2017 8:26 PM)  CLASS 3 SEVERE OBESITY DUE TO EXCESS CALORIES WITH SERIOUS COMORBIDITY AND BODY MASS INDEX (BMI) OF 40.0 TO 44.9 IN ADULT (E66.01) Impression: The patient meets weight loss surgery criteria. I think the patient would be an acceptable candidate for Laparoscopic Roux-en-Y Gastric bypass.  We briefly rediscussed laparoscopic Roux-en-Y gastric bypass. We rediscussed the operative and postoperative  process. We discussed the typical recovery period. She had no additional questions since we just talked in March. She has attended preoperative education class. All questions asked and answered.  Current Plans Pt Education - EMW_preopbariatric  DYSLIPIDEMIA (E78.5)   HYPERTENSION, ESSENTIAL (I10)   DIABETES MELLITUS TYPE 2 IN OBESE (E11.69)   MILD ANEMIA (D64.9) Impression: She has mild anemia. She has had a colonoscopy within the past 2 years which showed a benign polyp. I do not believe her hemoglobin is prohibitive to proceed with surgery.  Mary Sella. [name], MD, FACS General, Bariatric, & Minimally Invasive Surgery [facility], [place]

## 2017-11-13 NOTE — H&P (View-Only) (Signed)
Marisa Anderson Documented: 11/11/2017 3:21 PM Location: [facility] Patient #: [id number] DOB: [birth year] Married / Language: English / Race: Black or African American Female  History of Present Illness Marisa Anderson M. Candelaria Pies MD; 11/13/2017 8:24 PM) The patient is a 52 year old female who presents for a bariatric surgery evaluation. She comes in today for up to date preoperative consultation. I initially saw her in April 2018 but again most recently in March 2019. Her weight in 2018 was 256 pounds. She states that she is doing well. She denies any significant medical changes since I saw her 2 mo ago. She has completed 6 months of supervised weight loss with our medical dietitians. She has been evaluated and felt safe for surgery by psychology. Her upper GI was unremarkable except for some mild reflux. Her chest x-ray was unremarkable. She saw cardiology last spring for evaluation for clearance for surgery. She was felt to be low risk.  She had blood work done on September 25, 2017. She had some mild anemia with a hemoglobin of 10.5 and a hematocrit of 35.9. MCV was low at 75.9. Platelet count normal. She had a normal comprehensive metabolic panel with normal BUN and creatinine. Her alkaline phosphatase is slightly elevated at 123. Her hemoglobin A1c was 7.5. Total cholesterol 127, triglycerides, 73; HDL 52; LDL 60. Iron was high at 189, transferrin normal at 3 of 3, TIBC 424. Transferrin saturation 45%. Vitamin B1 normal. Vitamin B12 normal. Calcitrol normal at 67; vitamin D 25-hydroxy low at 10. TSH normal  She denies any chest pain, chest pressure, shortness of breath, orthopnea, paroxysmal nocturnal dyspnea, peripheral edema or personal or family history of blood clots.  09/2016 She is referred by Dr Vaughan Basta at [facility] in [place] for evaluation of weight loss surgery. She attended our seminar in person in [place]. She is interested in a gastric bypass.  One of her main goals is to improve her diabetes and to get off other medications. Despite numerous attempts for sustained weight loss she has been unsuccessful. She has tried Weight Watchers, meal replacement shakes like glucerna, and currently on saxenda.  Her comorbidities include hypertension, dyslipidemia, diabetes mellitus type 2  She denies any chest pain, chest pressure, shortness of breath, orthopnea, paroxysmal nocturnal dyspnea, peripheral edema or personal or family history of blood clots. She states that she was told she had a cardiac murmur at one point. She has very rare reflux. She has a daily bowel movement. She denies any melena or hematochezia. She had a colonoscopy in March 2017 which removed a benign polyp. She is a G2 P2. She's had a tubal ligation. She denies any dysuria or hematuria. She denies any specific joint pain. She has been a diabetic for a few years. She is just on oral agents. She states that her last hemoglobin A1c was around 7.4. She denies any TIAs or amaurosis fugax. She denies any tobacco or drug use. She drinks alcohol on a very rare occasion. She works at the [employer] Marisa Anderson M. [name], MD; 11/13/2017 8:26 PM) HEART MURMUR (R01.1) FATIGUE (R53.83) VITAMIN D DEFICIENCY (E55.9) MILD ANEMIA (D64.9) CLASS 3 SEVERE OBESITY DUE TO EXCESS CALORIES WITH SERIOUS COMORBIDITY AND BODY MASS INDEX (BMI) OF 40.0 TO 44.9 IN ADULT (E66.01) DYSLIPIDEMIA (E78.5)  Diagnostic Studies History (Marisa Anderson M. [name], MD; 11/13/2017 8:26 PM) Colonoscopy 1-5 years ago Mammogram within last year Pap Smear 1-5 years ago  Allergies Marisa Anderson, [name]; 11/11/2017  3:22 PM) No Known Drug Allergies [09/17/2017]: Allergies Reconciled  Medication History (Marisa Anderson, [name]; 11/11/2017 3:24 PM) Amlodipine-Atorvastatin (10-40MG  Tablet, Oral) Active. Valsartan-Hydrochlorothiazide (320-25MG  Tablet, Oral)  Active. Atorvastatin Calcium (  Tablet, Oral) Active. Invokamet XR (150-500MG  Tablet ER 24HR, Oral) Active. Metoprolol Tartrate (  Tablet, Oral) Active. Pioglitazone HCl (  Tablet, Oral) Active. Albuterol Sulfate ((2.5 MG/3ML)0.083% Nebulized Soln, Inhalation) Active. Vitamin D (50000U Tablet, Oral) Active. Ferrous Sulfate (325 (65 Fe)MG Tablet, Oral) Active. Medications Reconciled  Social History Marisa Anderson M. [name], MD; 11/13/2017 8:26 PM) Alcohol use Occasional alcohol use. Caffeine use Carbonated beverages, Tea. No drug use Tobacco use Never smoker.  Family History Marisa Anderson M. [name], MD; 11/13/2017 8:26 PM) Breast Cancer Family Members In General. Diabetes Mellitus Father, Sister. Hypertension Brother, Daughter, Father, Mother, Sister. Kidney Disease Father. Thyroid problems Sister.  Pregnancy / Birth History Marisa Anderson M. [name], MD; 11/13/2017 8:26 PM) Age at menarche 11 years. Gravida 3 Maternal age 96-25 Para 2 Regular periods  Other Problems Marisa Anderson M. [name], MD; 11/13/2017 8:26 PM) DIABETES MELLITUS TYPE 2 IN OBESE (E11.69) HYPERTENSION, ESSENTIAL (I10) HYPERCHOLESTEREMIA (E78.00)     Review of Systems Marisa Anderson M. Marisa Bridgers MD; 11/13/2017 8:20 PM) General Not Present- Appetite Loss, Chills, Fatigue, Fever, Night Sweats, Weight Gain and Weight Loss. Skin Not Present- Change in Wart/Mole, Dryness, Hives, Jaundice, New Lesions, Non-Healing Wounds, Rash and Ulcer. HEENT Not Present- Earache, Hearing Loss, Hoarseness, Nose Bleed, Oral Ulcers, Ringing in the Ears, Seasonal Allergies, Sinus Pain, Sore Throat, Visual Disturbances, Wears glasses/contact lenses and Yellow Eyes. Respiratory Not Present- Bloody sputum, Chronic Cough, Difficulty Breathing, Snoring and Wheezing. Breast Not Present- Breast Mass, Breast Pain, Nipple Discharge and Skin Changes. Cardiovascular Not Present- Chest Pain, Difficulty Breathing Lying Down, Leg Cramps, Palpitations, Rapid  Heart Rate, Shortness of Breath and Swelling of Extremities. Gastrointestinal Not Present- Abdominal Pain, Bloating, Bloody Stool, Change in Bowel Habits, Chronic diarrhea, Constipation, Difficulty Swallowing, Excessive gas, Gets full quickly at meals, Hemorrhoids, Indigestion, Nausea, Rectal Pain and Vomiting. Female Genitourinary Not Present- Frequency, Nocturia, Painful Urination, Pelvic Pain and Urgency. Musculoskeletal Not Present- Back Pain, Joint Pain, Joint Stiffness, Muscle Pain, Muscle Weakness and Swelling of Extremities. Neurological Not Present- Decreased Memory, Fainting, Headaches, Numbness, Seizures, Tingling, Tremor, Trouble walking and Weakness. Psychiatric Not Present- Anxiety, Bipolar, Change in Sleep Pattern, Depression, Fearful and Frequent crying. Endocrine Not Present- Cold Intolerance, Excessive Hunger, Hair Changes, Heat Intolerance, Hot flashes and New Diabetes. Hematology Not Present- Blood Thinners, Easy Bruising, Excessive bleeding, Gland problems, HIV and Persistent Infections.  Vitals (Marisa Anderson [name]; 11/11/2017 3:22 PM) 11/11/2017 3:22 PM Weight: 257.2 lb Height: 65in Body Surface Area: 2.2 m Body Mass Index: 42.8 kg/m  Temp.: 98.67F(Temporal)  Pulse: 102 (Regular)  BP: 138/88 (Sitting, Left Arm, Standard)      Physical Exam Marisa Anderson M. Aidan Caloca MD; 11/13/2017 8:20 PM)  General Mental Status-Alert. General Appearance-Consistent with stated age. Hydration-Well hydrated. Voice-Normal. Note: morbidly obese  Head and Neck Head-normocephalic, atraumatic with no lesions or palpable masses. Trachea-midline. Thyroid Gland Characteristics - normal size and consistency.  Eye Eyeball - Bilateral-Extraocular movements intact. Sclera/Conjunctiva - Bilateral-No scleral icterus.  ENMT Ears Pinna - Bilateral - Note: normal external ears. Mouth and Throat -Note:normal lips.   Chest and Lung Exam Chest and lung exam reveals  -quiet, even and easy respiratory effort with no use of accessory muscles and on auscultation, normal breath sounds, no adventitious sounds and normal vocal resonance. Inspection Chest Wall - Normal. Back - normal.  Breast - Did not examine.  Cardiovascular Cardiovascular examination  reveals -normal pedal pulses bilaterally. Note:mild SEMurmur  Abdomen Inspection Inspection of the abdomen reveals - No Hernias. Skin - Scar - no surgical scars. Palpation/Percussion Palpation and Percussion of the abdomen reveal - Soft, Non Tender, No Rebound tenderness, No Rigidity (guarding) and No hepatosplenomegaly. Auscultation Auscultation of the abdomen reveals - Bowel sounds normal.  Peripheral Vascular Upper Extremity Palpation - Pulses bilaterally normal.  Neurologic Neurologic evaluation reveals -alert and oriented x 3 with no impairment of recent or remote memory. Mental Status-Normal.  Neuropsychiatric The patient's mood and affect are described as -normal. Judgment and Insight-insight is appropriate concerning matters relevant to self.  Musculoskeletal Normal Exam - Left-Upper Extremity Strength Normal and Lower Extremity Strength Normal. Normal Exam - Right-Upper Extremity Strength Normal and Lower Extremity Strength Normal. Note: bilateral knee crepitus  Lymphatic Head & Neck  General Head & Neck Lymphatics: Bilateral - Description - Normal. Axillary - Did not examine. Femoral & Inguinal - Did not examine.    Assessment & Plan Marisa Anderson M. Samule Life MD; 11/13/2017 8:26 PM)  CLASS 3 SEVERE OBESITY DUE TO EXCESS CALORIES WITH SERIOUS COMORBIDITY AND BODY MASS INDEX (BMI) OF 40.0 TO 44.9 IN ADULT (E66.01) Impression: The patient meets weight loss surgery criteria. I think the patient would be an acceptable candidate for Laparoscopic Roux-en-Y Gastric bypass.  We briefly rediscussed laparoscopic Roux-en-Y gastric bypass. We rediscussed the operative and postoperative  process. We discussed the typical recovery period. She had no additional questions since we just talked in March. She has attended preoperative education class. All questions asked and answered.  Current Plans Pt Education - EMW_preopbariatric  DYSLIPIDEMIA (E78.5)   HYPERTENSION, ESSENTIAL (I10)   DIABETES MELLITUS TYPE 2 IN OBESE (E11.69)   MILD ANEMIA (D64.9) Impression: She has mild anemia. She has had a colonoscopy within the past 2 years which showed a benign polyp. I do not believe her hemoglobin is prohibitive to proceed with surgery.  Mary Sella. [name], MD, FACS General, Bariatric, & Minimally Invasive Surgery [facility], [place]

## 2017-11-19 NOTE — Patient Instructions (Addendum)
Marisa Anderson  11/19/2017   Your procedure is scheduled on: 11-25-17   Report to [facility] Main  Entrance    Report to admitting at 5:30AM    Call this number if you have problems the morning of surgery [phone]     Remember: NO SOLID FOOD AFTER 6:00 PM THE NIGHT BEFORE YOUR SURGERY. YOU MAY DRINK CLEAR FLUIDS. MORNING OF SURGERY DRINK:  1SHAKE BEFORE YOU LEAVE HOME, DRINK ALL OF THE SHAKE AT ONE TIME. THE SHAKE YOU DRINK BEFORE YOU LEAVE HOME WILL BE THE LAST FLUIDS YOU DRINK BEFORE SURGERY.     Take these medicines the morning of surgery with A SIP OF WATER: METOPROLOL, ALBUTEROL INHALER IF NEEDED (MAY BRING), ALBUTEROL NEBULIZER IF NEEDED, ATROVENT NEBULIZER IF NEEDED                                 You may not have any metal on your body including hair pins and              piercings  Do not wear jewelry, make-up, lotions, powders or perfumes, deodorant             Do not wear nail polish.  Do not shave  48 hours prior to surgery.               Do not bring valuables to the hospital. [facility] IS NOT             RESPONSIBLE   FOR VALUABLES.  Contacts, dentures or bridgework may not be worn into surgery.  Leave suitcase in the car. After surgery it may be brought to your room.               Please read over the following fact sheets you were given: _____________________________________________________________________    CLEAR LIQUID DIET   Foods Allowed                                                                     Foods Excluded  Coffee and tea, regular and decaf                             liquids that you cannot  Plain Jell-O in any flavor                                             see through such as: Fruit ices (not with fruit pulp)                                     milk, soups, orange juice  Iced Popsicles                                    All solid food Carbonated beverages, regular and  diet                                     Cranberry, grape and apple juices Sports drinks like Gatorade Lightly seasoned clear broth or consume(fat free) Sugar, honey syrup  Sample Menu Breakfast                                Lunch                                     Supper Cranberry juice                    Beef broth                            Chicken broth Jell-O                                     Grape juice                           Apple juice Coffee or tea                        Jell-O                                      Popsicle                                                Coffee or tea                        Coffee or tea     PAIN IS EXPECTED AFTER SURGERY AND WILL NOT BE COMPLETELY ELIMINATED. AMBULATION AND TYLENOL WILL HELP REDUCE INCISIONAL AND GAS PAIN. MOVEMENT IS KEY!  YOU ARE EXPECTED TO BE OUT OF BED WITHIN 4 HOURS OF ADMISSION TO YOUR PATIENT ROOM.  SITTING IN THE RECLINER THROUGHOUT THE DAY IS IMPORTANT FOR DRINKING FLUIDS AND MOVING GAS THROUGHOUT THE GI TRACT.  COMPRESSION STOCKINGS SHOULD BE WORN [facility] STAY UNLESS YOU ARE WALKING.   INCENTIVE SPIROMETER SHOULD BE USED EVERY HOUR WHILE AWAKE TO DECREASE POST-OPERATIVE COMPLICATIONS SUCH AS PNEUMONIA.  WHEN DISCHARGED HOME, IT IS IMPORTANT TO CONTINUE TO WALK EVERY HOUR AND USE THE INCENTIVE SPIROMETER EVERY HOUR.      _____________________________________________________________________   How to Manage Your Diabetes Before and After Surgery  Why is it important to control my blood sugar before and after surgery? . Improving blood sugar levels before and after surgery helps healing and can limit problems. . A way of improving blood sugar control is eating a healthy diet by: o  Eating less sugar and carbohydrates o  Increasing activity/exercise o  Talking with your doctor about reaching your blood sugar goals . High blood sugars (greater than 180 mg/dL) can raise  your risk of infections and slow your recovery, so you  will need to focus on controlling your diabetes during the weeks before surgery. . Make sure that the doctor who takes care of your diabetes knows about your planned surgery including the date and location.  How do I manage my blood sugar before surgery? . Check your blood sugar at least 4 times a day, starting 2 days before surgery, to make sure that the level is not too high or low. o Check your blood sugar the morning of your surgery when you wake up and every 2 hours until you get to the Short Stay unit. . If your blood sugar is less than 70 mg/dL, you will need to treat for low blood sugar: o Do not take insulin. o Treat a low blood sugar (less than 70 mg/dL) with  cup of clear juice (cranberry or apple), 4 glucose tablets, OR glucose gel. o Recheck blood sugar in 15 minutes after treatment (to make sure it is greater than 70 mg/dL). If your blood sugar is not greater than 70 mg/dL on recheck, call [phone] for further instructions. . Report your blood sugar to the short stay nurse when you get to Short Stay.  . If you are admitted to the hospital after surgery: o Your blood sugar will be checked by the staff and you will probably be given insulin after surgery (instead of oral diabetes medicines) to make sure you have good blood sugar levels. o The goal for blood sugar control after surgery is 80-180 mg/dL.   WHAT DO I DO ABOUT MY DIABETES MEDICATION?       . THE MORNING OF SURGERY, Do not take oral diabetes medicines (pills) the morning of surgery.    Patient Signature:  Date:   Nurse Signature:  Date:   Reviewed and Endorsed by [facility] Patient Education Committee, August 2015    [facility] - Preparing for Surgery Before surgery, you can play an important role.  Because skin is not sterile, your skin needs to be as free of germs as possible.  You can reduce the number of germs on your skin by washing with CHG (chlorahexidine gluconate) soap before surgery.  CHG is  an antiseptic cleaner which kills germs and bonds with the skin to continue killing germs even after washing. Please DO NOT use if you have an allergy to CHG or antibacterial soaps.  If your skin becomes reddened/irritated stop using the CHG and inform your nurse when you arrive at Short Stay. Do not shave (including legs and underarms) for at least 48 hours prior to the first CHG shower.  You may shave your face/neck. Please follow these instructions carefully:  1.  Shower with CHG Soap the night before surgery and the  morning of Surgery.  2.  If you choose to wash your hair, wash your hair first as usual with your  normal  shampoo.  3.  After you shampoo, rinse your hair and body thoroughly to remove the  shampoo.                           4.  Use CHG as you would any other liquid soap.  You can apply chg directly  to the skin and wash                       Gently with a scrungie or clean washcloth.  5.  Apply the CHG Soap to your body ONLY FROM THE NECK DOWN.   Do not use on face/ open                           Wound or open sores. Avoid contact with eyes, ears mouth and genitals (private parts).                       Wash face,  Genitals (private parts) with your normal soap.             6.  Wash thoroughly, paying special attention to the area where your surgery  will be performed.  7.  Thoroughly rinse your body with warm water from the neck down.  8.  DO NOT shower/wash with your normal soap after using and rinsing off  the CHG Soap.                9.  Pat yourself dry with a clean towel.            10.  Wear clean pajamas.            11.  Place clean sheets on your bed the night of your first shower and do not  sleep with pets. Day of Surgery : Do not apply any lotions/deodorants the morning of surgery.  Please wear clean clothes to the hospital/surgery center.  FAILURE TO FOLLOW THESE INSTRUCTIONS MAY RESULT IN THE CANCELLATION OF YOUR SURGERY PATIENT  SIGNATURE_________________________________  NURSE SIGNATURE__________________________________  ________________________________________________________________________   Marisa Anderson  An incentive spirometer is a tool that can help keep your lungs clear and active. This tool measures how well you are filling your lungs with each breath. Taking long deep breaths may help reverse or decrease the chance of developing breathing (pulmonary) problems (especially infection) following:  A long period of time when you are unable to move or be active. BEFORE THE PROCEDURE   If the spirometer includes an indicator to show your best effort, your nurse or respiratory therapist will set it to a desired goal.  If possible, sit up straight or lean slightly forward. Try not to slouch.  Hold the incentive spirometer in an upright position. INSTRUCTIONS FOR USE  1. Sit on the edge of your bed if possible, or sit up as far as you can in bed or on a chair. 2. Hold the incentive spirometer in an upright position. 3. Breathe out normally. 4. Place the mouthpiece in your mouth and seal your lips tightly around it. 5. Breathe in slowly and as deeply as possible, raising the piston or the ball toward the top of the column. 6. Hold your breath for 3-5 seconds or for as long as possible. Allow the piston or ball to fall to the bottom of the column. 7. Remove the mouthpiece from your mouth and breathe out normally. 8. Rest for a few seconds and repeat Steps 1 through 7 at least 10 times every 1-2 hours when you are awake. Take your time and take a few normal breaths between deep breaths. 9. The spirometer may include an indicator to show your best effort. Use the indicator as a goal to work toward during each repetition. 10. After each set of 10 deep breaths, practice coughing to be sure your lungs are clear. If you have an incision (the cut made at the time of surgery), support your incision when coughing  by placing a pillow  or rolled up towels firmly against it. Once you are able to get out of bed, walk around indoors and cough well. You may stop using the incentive spirometer when instructed by your caregiver.  RISKS AND COMPLICATIONS  Take your time so you do not get dizzy or light-headed.  If you are in pain, you may need to take or ask for pain medication before doing incentive spirometry. It is harder to take a deep breath if you are having pain. AFTER USE  Rest and breathe slowly and easily.  It can be helpful to keep track of a log of your progress. Your caregiver can provide you with a simple table to help with this. If you are using the spirometer at home, follow these instructions: SEEK MEDICAL CARE IF:   You are having difficultly using the spirometer.  You have trouble using the spirometer as often as instructed.  Your pain medication is not giving enough relief while using the spirometer.  You develop fever of 100.5 F (38.1 C) or higher. SEEK IMMEDIATE MEDICAL CARE IF:   You cough up bloody sputum that had not been present before.  You develop fever of 102 F (38.9 C) or greater.  You develop worsening pain at or near the incision site. MAKE SURE YOU:   Understand these instructions.  Will watch your condition.  Will get help right away if you are not doing well or get worse. Document Released: 10/28/2006 Document Revised: 09/09/2011 Document Reviewed: 12/29/2006 [facility] Patient Information 2014 [place], [place].   ________________________________________________________________________

## 2017-11-19 NOTE — Progress Notes (Signed)
LOV AUGUST OLINGER PA 09-25-17 ON CHART   LABS 09-25-17 ON CHART FROM [facility] [place]io [place], [place]  EKG 05-13-17 Epic   CXR 05-13-17 Epic   ECHO 11-27-16 Epic   LOV/CARDIAC CLEARANCE 11-12-16 Epic

## 2017-11-20 ENCOUNTER — Encounter (HOSPITAL_COMMUNITY): Payer: Self-pay | Admitting: Emergency Medicine

## 2017-11-20 ENCOUNTER — Other Ambulatory Visit: Payer: Self-pay

## 2017-11-20 ENCOUNTER — Encounter (HOSPITAL_COMMUNITY)
Admission: RE | Admit: 2017-11-20 | Discharge: 2017-11-20 | Disposition: A | Discharge status: Home / Self Care | Payer: BLUE CROSS/BLUE SHIELD | Source: Ambulatory Visit | Attending: General Surgery | Admitting: General Surgery

## 2017-11-20 DIAGNOSIS — Z6841 Body Mass Index (BMI) 40.0 and over, adult: Secondary | ICD-10-CM | POA: Insufficient documentation

## 2017-11-20 DIAGNOSIS — E119 Type 2 diabetes mellitus without complications: Secondary | ICD-10-CM | POA: Diagnosis not present

## 2017-11-20 DIAGNOSIS — I10 Essential (primary) hypertension: Secondary | ICD-10-CM | POA: Diagnosis not present

## 2017-11-20 DIAGNOSIS — Z01812 Encounter for preprocedural laboratory examination: Secondary | ICD-10-CM | POA: Insufficient documentation

## 2017-11-20 HISTORY — DX: Unspecified asthma, uncomplicated: J45.909

## 2017-11-20 HISTORY — DX: Cardiac murmur, unspecified: R01.1

## 2017-11-20 HISTORY — DX: Anemia, unspecified: D64.9

## 2017-11-20 LAB — ABO/RH: ABO/RH(D): AB POS

## 2017-11-20 LAB — COMPREHENSIVE METABOLIC PANEL
ALBUMIN: 3.5 g/dL (ref 3.5–5.0)
ALT: 18 U/L (ref 14–54)
AST: 18 U/L (ref 15–41)
Alkaline Phosphatase: 92 U/L (ref 38–126)
Anion gap: 11 (ref 5–15)
BUN: 18 mg/dL (ref 6–20)
CHLORIDE: 105 mmol/L (ref 101–111)
CO2: 25 mmol/L (ref 22–32)
Calcium: 9.3 mg/dL (ref 8.9–10.3)
Creatinine, Ser: 1.05 mg/dL — ABNORMAL HIGH (ref 0.44–1.00)
GFR calc Af Amer: >60 mL/min (ref >60)
GFR calc non Af Amer: 60 mL/min — ABNORMAL LOW (ref >60)
Glucose, Bld: 154 mg/dL — ABNORMAL HIGH (ref 65–99)
POTASSIUM: 4.4 mmol/L (ref 3.5–5.1)
Sodium: 141 mmol/L (ref 135–145)
Total Bilirubin: 0.5 mg/dL (ref 0.3–1.2)
Total Protein: 7.5 g/dL (ref 6.5–8.1)

## 2017-11-20 LAB — CBC WITH DIFFERENTIAL/PLATELET
Basophils Absolute: 0 10*3/uL (ref 0.0–0.1)
Basophils Relative: 0 %
EOS PCT: 4 %
Eosinophils Absolute: 0.2 10*3/uL (ref 0.0–0.7)
HEMATOCRIT: 41.2 % (ref 36.0–46.0)
Hemoglobin: 12.8 g/dL (ref 12.0–15.0)
LYMPHS PCT: 23 %
Lymphs Abs: 1.2 10*3/uL (ref 0.7–4.0)
MCH: 26.4 pg (ref 26.0–34.0)
MCHC: 31.1 g/dL (ref 30.0–36.0)
MCV: 84.9 fL (ref 78.0–100.0)
Monocytes Absolute: 0.3 10*3/uL (ref 0.1–1.0)
Monocytes Relative: 5 %
NEUTROS ABS: 3.6 10*3/uL (ref 1.7–7.7)
NEUTROS PCT: 68 %
Platelets: 299 10*3/uL (ref 150–400)
RBC: 4.85 MIL/uL (ref 3.87–5.11)
RDW: 21.7 % — AB (ref 11.5–15.5)
WBC: 5.3 10*3/uL (ref 4.0–10.5)

## 2017-11-20 LAB — HEMOGLOBIN A1C
Hgb A1c MFr Bld: 6.3 % — ABNORMAL HIGH (ref 4.8–5.6)
Mean Plasma Glucose: 134.11 mg/dL

## 2017-11-20 LAB — GLUCOSE, CAPILLARY: Glucose-Capillary: 161 mg/dL — ABNORMAL HIGH (ref 65–99)

## 2017-11-20 LAB — HCG, SERUM, QUALITATIVE: Preg, Serum: NEGATIVE

## 2017-11-21 ENCOUNTER — Other Ambulatory Visit (HOSPITAL_COMMUNITY): Payer: Self-pay | Admitting: Emergency Medicine

## 2017-11-24 NOTE — Anesthesia Preprocedure Evaluation (Addendum)
Anesthesia Evaluation  Patient identified by MRN, date of birth, ID band Patient awake    Reviewed: Allergy & Precautions, NPO status , Patient's Chart, lab work & pertinent test results  History of Anesthesia Complications Negative for: history of anesthetic complications  Airway Mallampati: III  TM Distance: >3 FB Neck ROM: Full    Dental no notable dental hx. (+) Dental Advisory Given   Pulmonary asthma ,    Pulmonary exam normal        Cardiovascular hypertension, Pt. on home beta blockers and Pt. on medications Normal cardiovascular exam  Study Conclusions  - Left ventricle: The cavity size was normal. Wall thickness was increased in a pattern of moderate LVH. Systolic function was normal. The estimated ejection fraction was in the range of 60% to 65%. Wall motion was normal; there were no regional wall motion abnormalities. Left ventricular diastolic function parameters were normal.     Neuro/Psych negative neurological ROS  negative psych ROS   GI/Hepatic negative GI ROS, Neg liver ROS,   Endo/Other  diabetesMorbid obesity  Renal/GU negative Renal ROS     Musculoskeletal negative musculoskeletal ROS (+)   Abdominal   Peds  Hematology negative hematology ROS (+)   Anesthesia Other Findings Day of surgery medications reviewed with the patient.  Reproductive/Obstetrics                            Anesthesia Physical Anesthesia Plan  ASA: III  Anesthesia Plan: General   Post-op Pain Management:    Induction: Intravenous  PONV Risk Score and Plan: 4 or greater and Ondansetron, Dexamethasone, Scopolamine patch - Pre-op and Diphenhydramine  Airway Management Planned: Oral ETT  Additional Equipment:   Intra-op Plan:   Post-operative Plan: Extubation in OR  Informed Consent: I have reviewed the patients History and Physical, chart, labs and discussed the procedure  including the risks, benefits and alternatives for the proposed anesthesia with the patient or authorized representative who has indicated his/her understanding and acceptance.   Dental advisory given  Plan Discussed with: Anesthesiologist  Anesthesia Plan Comments: (ERAS)       Anesthesia Quick Evaluation

## 2017-11-25 ENCOUNTER — Inpatient Hospital Stay (HOSPITAL_COMMUNITY): Payer: BLUE CROSS/BLUE SHIELD | Admitting: Anesthesiology

## 2017-11-25 ENCOUNTER — Other Ambulatory Visit: Payer: Self-pay

## 2017-11-25 ENCOUNTER — Inpatient Hospital Stay (HOSPITAL_COMMUNITY)
Admission: RE | Admit: 2017-11-25 | Discharge: 2017-11-27 | DRG: 621 | Disposition: A | Discharge status: Home / Self Care | Payer: BLUE CROSS/BLUE SHIELD | Source: Ambulatory Visit | Attending: General Surgery | Admitting: General Surgery

## 2017-11-25 ENCOUNTER — Encounter (HOSPITAL_COMMUNITY)
Admission: RE | Disposition: A | Discharge status: Home / Self Care | Payer: Self-pay | Source: Ambulatory Visit | Attending: General Surgery

## 2017-11-25 ENCOUNTER — Encounter (HOSPITAL_COMMUNITY): Payer: Self-pay | Admitting: Emergency Medicine

## 2017-11-25 DIAGNOSIS — E119 Type 2 diabetes mellitus without complications: Secondary | ICD-10-CM | POA: Diagnosis present

## 2017-11-25 DIAGNOSIS — I10 Essential (primary) hypertension: Secondary | ICD-10-CM | POA: Diagnosis present

## 2017-11-25 DIAGNOSIS — E559 Vitamin D deficiency, unspecified: Secondary | ICD-10-CM | POA: Diagnosis present

## 2017-11-25 DIAGNOSIS — Z841 Family history of disorders of kidney and ureter: Secondary | ICD-10-CM | POA: Diagnosis not present

## 2017-11-25 DIAGNOSIS — Z9884 Bariatric surgery status: Secondary | ICD-10-CM

## 2017-11-25 DIAGNOSIS — D649 Anemia, unspecified: Secondary | ICD-10-CM | POA: Diagnosis present

## 2017-11-25 DIAGNOSIS — J45909 Unspecified asthma, uncomplicated: Secondary | ICD-10-CM | POA: Diagnosis present

## 2017-11-25 DIAGNOSIS — Z833 Family history of diabetes mellitus: Secondary | ICD-10-CM | POA: Diagnosis not present

## 2017-11-25 DIAGNOSIS — E785 Hyperlipidemia, unspecified: Secondary | ICD-10-CM | POA: Diagnosis present

## 2017-11-25 DIAGNOSIS — Z803 Family history of malignant neoplasm of breast: Secondary | ICD-10-CM | POA: Diagnosis not present

## 2017-11-25 DIAGNOSIS — E7800 Pure hypercholesterolemia, unspecified: Secondary | ICD-10-CM | POA: Diagnosis present

## 2017-11-25 DIAGNOSIS — Z6841 Body Mass Index (BMI) 40.0 and over, adult: Secondary | ICD-10-CM

## 2017-11-25 DIAGNOSIS — R011 Cardiac murmur, unspecified: Secondary | ICD-10-CM | POA: Diagnosis present

## 2017-11-25 DIAGNOSIS — K219 Gastro-esophageal reflux disease without esophagitis: Secondary | ICD-10-CM | POA: Diagnosis present

## 2017-11-25 DIAGNOSIS — Z8249 Family history of ischemic heart disease and other diseases of the circulatory system: Secondary | ICD-10-CM | POA: Diagnosis not present

## 2017-11-25 HISTORY — PX: GASTRIC ROUX-EN-Y: SHX5262

## 2017-11-25 LAB — GLUCOSE, CAPILLARY
GLUCOSE-CAPILLARY: 158 mg/dL — AB (ref 65–99)
GLUCOSE-CAPILLARY: 171 mg/dL — AB (ref 65–99)
GLUCOSE-CAPILLARY: 214 mg/dL — AB (ref 65–99)
GLUCOSE-CAPILLARY: 144 mg/dL — AB (ref 65–99)
Glucose-Capillary: 165 mg/dL — ABNORMAL HIGH (ref 65–99)

## 2017-11-25 LAB — HEMOGLOBIN AND HEMATOCRIT, BLOOD
HEMATOCRIT: 42.5 % (ref 36.0–46.0)
Hemoglobin: 13.8 g/dL (ref 12.0–15.0)

## 2017-11-25 LAB — TYPE AND SCREEN
ABO/RH(D): AB POS
Antibody Screen: NEGATIVE

## 2017-11-25 SURGERY — LAPAROSCOPIC ROUX-EN-Y GASTRIC BYPASS WITH UPPER ENDOSCOPY
Anesthesia: General | Site: Abdomen

## 2017-11-25 MED ORDER — PROPOFOL 10 MG/ML IV BOLUS
INTRAVENOUS | Status: AC
  Filled 2017-11-25: qty 20

## 2017-11-25 MED ORDER — KETAMINE HCL 10 MG/ML IJ SOLN
INTRAMUSCULAR | Status: DC | PRN
  Administered 2017-11-25 (×2): 10 mg via INTRAVENOUS
  Administered 2017-11-25: 20 mg via INTRAVENOUS

## 2017-11-25 MED ORDER — AMLODIPINE BESYLATE 10 MG PO TABS
10.0000 mg | ORAL_TABLET | Freq: Every day | ORAL | Status: DC
Start: 2017-11-26 — End: 2017-11-27
  Administered 2017-11-26: 10 mg via ORAL
  Filled 2017-11-25: qty 1

## 2017-11-25 MED ORDER — ACETAMINOPHEN 500 MG PO TABS
1000.0000 mg | ORAL_TABLET | ORAL | Status: AC
  Administered 2017-11-25: 1000 mg via ORAL
  Filled 2017-11-25: qty 2

## 2017-11-25 MED ORDER — PROMETHAZINE HCL 25 MG/ML IJ SOLN: 12.5000 mg | Freq: Four times a day (QID) | INTRAMUSCULAR | Status: DC | PRN

## 2017-11-25 MED ORDER — ENOXAPARIN SODIUM 30 MG/0.3ML ~~LOC~~ SOLN
30.0000 mg | Freq: Two times a day (BID) | SUBCUTANEOUS | Status: DC
  Administered 2017-11-25 – 2017-11-27 (×4): 30 mg via SUBCUTANEOUS
  Filled 2017-11-25 (×4): qty 0.3

## 2017-11-25 MED ORDER — AMLODIPINE-ATORVASTATIN 10-40 MG PO TABS: 1.0000 | ORAL_TABLET | Freq: Every day | ORAL | Status: DC

## 2017-11-25 MED ORDER — ACETAMINOPHEN 160 MG/5ML PO SOLN
650.0000 mg | Freq: Four times a day (QID) | ORAL | Status: DC
  Administered 2017-11-25 – 2017-11-27 (×7): 650 mg via ORAL
  Filled 2017-11-25 (×7): qty 20.3

## 2017-11-25 MED ORDER — LABETALOL HCL 5 MG/ML IV SOLN
INTRAVENOUS | Status: AC
  Administered 2017-11-25: 5 mg via INTRAVENOUS
  Filled 2017-11-25: qty 4

## 2017-11-25 MED ORDER — LACTATED RINGERS IR SOLN
Status: DC | PRN
  Administered 2017-11-25: 1000 mL

## 2017-11-25 MED ORDER — MIDAZOLAM HCL 2 MG/2ML IJ SOLN
INTRAMUSCULAR | Status: AC
  Filled 2017-11-25: qty 2

## 2017-11-25 MED ORDER — PREMIER PROTEIN SHAKE
2.0000 [oz_av] | ORAL | Status: DC
  Administered 2017-11-26 – 2017-11-27 (×8): 2 [oz_av] via ORAL

## 2017-11-25 MED ORDER — DIPHENHYDRAMINE HCL 50 MG/ML IJ SOLN: 12.5000 mg | Freq: Three times a day (TID) | INTRAMUSCULAR | Status: DC | PRN

## 2017-11-25 MED ORDER — MORPHINE SULFATE (PF) 2 MG/ML IV SOLN: 1.0000 mg | INTRAVENOUS | Status: DC | PRN

## 2017-11-25 MED ORDER — ONDANSETRON HCL 4 MG/2ML IJ SOLN
INTRAMUSCULAR | Status: DC | PRN
  Administered 2017-11-25: 4 mg via INTRAVENOUS

## 2017-11-25 MED ORDER — BUPIVACAINE LIPOSOME 1.3 % IJ SUSP
20.0000 mL | Freq: Once | INTRAMUSCULAR | Status: AC
  Administered 2017-11-25: 20 mL
  Filled 2017-11-25: qty 20

## 2017-11-25 MED ORDER — SCOPOLAMINE 1 MG/3DAYS TD PT72SCOPOLAMINE 1 MG/3DAYS
1.0000 | MEDICATED_PATCH | TRANSDERMAL | Status: DC
Start: 2017-11-25 — End: 2017-11-25
  Administered 2017-11-25: 1.5 mg via TRANSDERMAL
  Filled 2017-11-25: qty 1

## 2017-11-25 MED ORDER — IPRATROPIUM BROMIDE 0.02 % IN SOLN: 0.5000 mg | Freq: Four times a day (QID) | RESPIRATORY_TRACT | Status: DC | PRN

## 2017-11-25 MED ORDER — CHLORHEXIDINE GLUCONATE 4 % EX LIQD: 60.0000 mL | Freq: Once | CUTANEOUS | Status: DC

## 2017-11-25 MED ORDER — LABETALOL HCL 5 MG/ML IV SOLN
5.0000 mg | INTRAVENOUS | Status: AC | PRN
Start: 2017-11-25 — End: 2017-11-25
  Administered 2017-11-25 (×2): 5 mg via INTRAVENOUS

## 2017-11-25 MED ORDER — PROPOFOL 10 MG/ML IV BOLUS
INTRAVENOUS | Status: DC | PRN
  Administered 2017-11-25: 200 mg via INTRAVENOUS

## 2017-11-25 MED ORDER — DEXAMETHASONE SODIUM PHOSPHATE 4 MG/ML IJ SOLN: 4.0000 mg | INTRAMUSCULAR | Status: DC

## 2017-11-25 MED ORDER — KETAMINE HCL 10 MG/ML IJ SOLN
INTRAMUSCULAR | Status: AC
  Filled 2017-11-25: qty 1

## 2017-11-25 MED ORDER — GABAPENTIN 100 MG PO CAPS
200.0000 mg | ORAL_CAPSULE | Freq: Two times a day (BID) | ORAL | Status: DC
  Administered 2017-11-25 – 2017-11-26 (×3): 200 mg via ORAL
  Filled 2017-11-25 (×4): qty 2

## 2017-11-25 MED ORDER — SUCCINYLCHOLINE CHLORIDE 200 MG/10ML IV SOSY
PREFILLED_SYRINGE | INTRAVENOUS | Status: DC | PRN
  Administered 2017-11-25: 120 mg via INTRAVENOUS

## 2017-11-25 MED ORDER — ONDANSETRON HCL 4 MG/2ML IJ SOLN: 4.0000 mg | Freq: Four times a day (QID) | INTRAMUSCULAR | Status: DC | PRN

## 2017-11-25 MED ORDER — GABAPENTIN 300 MG PO CAPS
300.0000 mg | ORAL_CAPSULE | ORAL | Status: AC
  Administered 2017-11-25: 300 mg via ORAL
  Filled 2017-11-25: qty 1

## 2017-11-25 MED ORDER — SODIUM CHLORIDE 0.9 % IJ SOLN
INTRAMUSCULAR | Status: DC | PRN
  Administered 2017-11-25: 50 mL

## 2017-11-25 MED ORDER — SUGAMMADEX SODIUM 500 MG/5ML IV SOLN
INTRAVENOUS | Status: DC | PRN
  Administered 2017-11-25: 300 mg via INTRAVENOUS

## 2017-11-25 MED ORDER — HYDRALAZINE HCL 20 MG/ML IJ SOLN
5.0000 mg | INTRAMUSCULAR | Status: AC | PRN
  Administered 2017-11-25 (×2): 5 mg via INTRAVENOUS

## 2017-11-25 MED ORDER — PROMETHAZINE HCL 25 MG/ML IJ SOLN: 6.2500 mg | INTRAMUSCULAR | Status: DC | PRN

## 2017-11-25 MED ORDER — ROCURONIUM BROMIDE 10 MG/ML (PF) SYRINGE
PREFILLED_SYRINGE | INTRAVENOUS | Status: AC
  Filled 2017-11-25: qty 5

## 2017-11-25 MED ORDER — EVICEL 5 ML EX KIT
PACK | Freq: Once | CUTANEOUS | Status: AC
  Administered 2017-11-25: 5 mL
  Filled 2017-11-25: qty 1

## 2017-11-25 MED ORDER — FENTANYL CITRATE (PF) 100 MCG/2ML IJ SOLN
INTRAMUSCULAR | Status: AC
  Filled 2017-11-25: qty 2

## 2017-11-25 MED ORDER — AMLODIPINE BESYLATE 10 MG PO TABS
10.0000 mg | ORAL_TABLET | Freq: Once | ORAL | Status: AC
  Administered 2017-11-25: 10 mg via ORAL
  Filled 2017-11-25: qty 1

## 2017-11-25 MED ORDER — CEFOTETAN DISODIUM 2 G IJ SOLR
INTRAMUSCULAR | Status: AC
  Filled 2017-11-25: qty 2

## 2017-11-25 MED ORDER — HYDRALAZINE HCL 20 MG/ML IJ SOLN
INTRAMUSCULAR | Status: AC
  Administered 2017-11-25: 5 mg via INTRAVENOUS
  Filled 2017-11-25: qty 1

## 2017-11-25 MED ORDER — FENTANYL CITRATE (PF) 100 MCG/2ML IJ SOLN
25.0000 ug | INTRAMUSCULAR | Status: DC | PRN
  Administered 2017-11-25 (×2): 50 ug via INTRAVENOUS

## 2017-11-25 MED ORDER — LIDOCAINE 2% (20 MG/ML) 5 ML SYRINGE
INTRAMUSCULAR | Status: DC | PRN
  Administered 2017-11-25: 100 mg via INTRAVENOUS

## 2017-11-25 MED ORDER — STERILE WATER FOR IRRIGATION IR SOLN
Status: DC | PRN
  Administered 2017-11-25: 1000 mL

## 2017-11-25 MED ORDER — LABETALOL HCL 5 MG/ML IV SOLN
5.0000 mg | Freq: Once | INTRAVENOUS | Status: AC
  Administered 2017-11-25: 5 mg via INTRAVENOUS

## 2017-11-25 MED ORDER — LIDOCAINE 2% (20 MG/ML) 5 ML SYRINGE
INTRAMUSCULAR | Status: AC
  Filled 2017-11-25: qty 5

## 2017-11-25 MED ORDER — LACTATED RINGERS IV SOLN
INTRAVENOUS | Status: DC | PRN
  Administered 2017-11-25 (×2): via INTRAVENOUS

## 2017-11-25 MED ORDER — CEFOTETAN DISODIUM-DEXTROSE 2-2.08 GM-%(50ML) IV SOLR
2.0000 g | INTRAVENOUS | Status: AC
  Administered 2017-11-25: 2 g via INTRAVENOUS

## 2017-11-25 MED ORDER — LIDOCAINE HCL 2 % IJ SOLN
INTRAMUSCULAR | Status: AC
  Filled 2017-11-25: qty 20

## 2017-11-25 MED ORDER — 0.9 % SODIUM CHLORIDE (POUR BTL) OPTIME
TOPICAL | Status: DC | PRN
  Administered 2017-11-25: 1000 mL

## 2017-11-25 MED ORDER — LABETALOL HCL 5 MG/ML IV SOLN: 10.0000 mg | INTRAVENOUS | Status: DC | PRN

## 2017-11-25 MED ORDER — POTASSIUM CHLORIDE IN NACL 20-0.45 MEQ/L-% IV SOLN
INTRAVENOUS | Status: DC
  Administered 2017-11-25 – 2017-11-27 (×4): via INTRAVENOUS
  Filled 2017-11-25 (×6): qty 1000

## 2017-11-25 MED ORDER — SUCCINYLCHOLINE CHLORIDE 200 MG/10ML IV SOSY
PREFILLED_SYRINGE | INTRAVENOUS | Status: AC
  Filled 2017-11-25: qty 10

## 2017-11-25 MED ORDER — ENALAPRILAT 1.25 MG/ML IV SOLN
1.2500 mg | Freq: Four times a day (QID) | INTRAVENOUS | Status: DC | PRN
  Administered 2017-11-26: 1.25 mg via INTRAVENOUS
  Filled 2017-11-25: qty 1

## 2017-11-25 MED ORDER — ROCURONIUM BROMIDE 10 MG/ML (PF) SYRINGE
PREFILLED_SYRINGE | INTRAVENOUS | Status: DC | PRN
  Administered 2017-11-25: 50 mg via INTRAVENOUS
  Administered 2017-11-25 (×2): 10 mg via INTRAVENOUS
  Administered 2017-11-25: 20 mg via INTRAVENOUS

## 2017-11-25 MED ORDER — FENTANYL CITRATE (PF) 100 MCG/2ML IJ SOLN
INTRAMUSCULAR | Status: AC
  Administered 2017-11-25: 50 ug via INTRAVENOUS
  Filled 2017-11-25: qty 2

## 2017-11-25 MED ORDER — SODIUM CHLORIDE 0.9 % IJ SOLN
INTRAMUSCULAR | Status: AC
  Filled 2017-11-25: qty 50

## 2017-11-25 MED ORDER — ATORVASTATIN CALCIUM 40 MG PO TABS
40.0000 mg | ORAL_TABLET | Freq: Every day | ORAL | Status: DC
Start: 2017-11-26 — End: 2017-11-27
  Administered 2017-11-26: 40 mg via ORAL
  Filled 2017-11-25: qty 1

## 2017-11-25 MED ORDER — ALBUTEROL SULFATE (2.5 MG/3ML) 0.083% IN NEBU: 3.0000 mL | INHALATION_SOLUTION | Freq: Four times a day (QID) | RESPIRATORY_TRACT | Status: DC | PRN

## 2017-11-25 MED ORDER — HEPARIN SODIUM (PORCINE) 5000 UNIT/ML IJ SOLN
5000.0000 [IU] | INTRAMUSCULAR | Status: AC
  Administered 2017-11-25: 5000 [IU] via SUBCUTANEOUS
  Filled 2017-11-25: qty 1

## 2017-11-25 MED ORDER — DEXAMETHASONE SODIUM PHOSPHATE 10 MG/ML IJ SOLN
INTRAMUSCULAR | Status: DC | PRN
  Administered 2017-11-25: 10 mg via INTRAVENOUS

## 2017-11-25 MED ORDER — MIDAZOLAM HCL 5 MG/5ML IJ SOLN
INTRAMUSCULAR | Status: DC | PRN
  Administered 2017-11-25: 2 mg via INTRAVENOUS

## 2017-11-25 MED ORDER — SIMETHICONE 80 MG PO CHEW: 80.0000 mg | CHEWABLE_TABLET | Freq: Four times a day (QID) | ORAL | Status: DC | PRN

## 2017-11-25 MED ORDER — FAMOTIDINE IN NACL 20-0.9 MG/50ML-% IV SOLN
20.0000 mg | Freq: Two times a day (BID) | INTRAVENOUS | Status: DC
  Administered 2017-11-25 – 2017-11-26 (×4): 20 mg via INTRAVENOUS
  Filled 2017-11-25 (×4): qty 50

## 2017-11-25 MED ORDER — METOPROLOL SUCCINATE ER 50 MG PO TB24
50.0000 mg | ORAL_TABLET | Freq: Every day | ORAL | Status: DC
  Administered 2017-11-26: 50 mg via ORAL
  Filled 2017-11-25: qty 1

## 2017-11-25 MED ORDER — APREPITANT 40 MG PO CAPS
40.0000 mg | ORAL_CAPSULE | ORAL | Status: AC
  Administered 2017-11-25: 40 mg via ORAL
  Filled 2017-11-25: qty 1

## 2017-11-25 MED ORDER — DIPHENHYDRAMINE HCL 50 MG/ML IJ SOLN
INTRAMUSCULAR | Status: DC | PRN
  Administered 2017-11-25: 12.5 mg via INTRAVENOUS

## 2017-11-25 MED ORDER — FENTANYL CITRATE (PF) 100 MCG/2ML IJ SOLN
INTRAMUSCULAR | Status: DC | PRN
  Administered 2017-11-25 (×2): 50 ug via INTRAVENOUS
  Administered 2017-11-25: 100 ug via INTRAVENOUS

## 2017-11-25 MED ORDER — INSULIN ASPART 100 UNIT/ML ~~LOC~~ SOLN
0.0000 [IU] | SUBCUTANEOUS | Status: DC
  Administered 2017-11-25: 7 [IU] via SUBCUTANEOUS
  Administered 2017-11-25 – 2017-11-26 (×2): 3 [IU] via SUBCUTANEOUS
  Administered 2017-11-26: 4 [IU] via SUBCUTANEOUS
  Administered 2017-11-27 (×2): 3 [IU] via SUBCUTANEOUS

## 2017-11-25 MED ORDER — OXYCODONE HCL 5 MG/5ML PO SOLN: 5.0000 mg | ORAL | Status: DC | PRN

## 2017-11-25 MED ORDER — LIDOCAINE 20MG/ML (2%) 15 ML SYRINGE OPTIME
INTRAMUSCULAR | Status: DC | PRN
  Administered 2017-11-25: 1.5 mg/kg/h via INTRAVENOUS

## 2017-11-25 MED ORDER — SCOPOLAMINE 1 MG/3DAYS TD PT72: 1.0000 | MEDICATED_PATCH | TRANSDERMAL | Status: DC

## 2017-11-25 SURGICAL SUPPLY — 67 items
APPLIER CLIP ROT 13.4 12 LRG (CLIP)
BANDAGE ADH SHEER 1  50/CT (GAUZE/BANDAGES/DRESSINGS) ×12 IMPLANT
BENZOIN TINCTURE PRP APPL 2/3 (GAUZE/BANDAGES/DRESSINGS) ×2 IMPLANT
BLADE SURG SZ11 CARB STEEL (BLADE) ×2 IMPLANT
CABLE HIGH FREQUENCY MONO STRZ (ELECTRODE) ×2 IMPLANT
CHLORAPREP W/TINT 26ML (MISCELLANEOUS) ×4 IMPLANT
CLIP APPLIE ROT 13.4 12 LRG (CLIP) IMPLANT
CLIP SUT LAPRA TY ABSORB (SUTURE) ×4 IMPLANT
CUTTER FLEX LINEAR 45M (STAPLE) IMPLANT
DEVICE SUT QUICK LOAD TK 5 (STAPLE) IMPLANT
DEVICE SUT TI-KNOT TK 5X26 (MISCELLANEOUS) IMPLANT
DEVICE SUTURE ENDOST 10MM (ENDOMECHANICALS) ×2 IMPLANT
DRAIN PENROSE 18X1/4 LTX STRL (WOUND CARE) ×2 IMPLANT
ELECT REM PT RETURN 15FT ADLT (MISCELLANEOUS) ×2 IMPLANT
GAUZE 4X4 16PLY RFD (DISPOSABLE) ×2 IMPLANT
GAUZE SPONGE 4X4 12PLY STRL (GAUZE/BANDAGES/DRESSINGS) IMPLANT
GLOVE BIO SURGEON STRL SZ7.5 (GLOVE) ×2 IMPLANT
GLOVE BIOGEL PI IND STRL 6.5 (GLOVE) ×3 IMPLANT
GLOVE BIOGEL PI IND STRL 7.0 (GLOVE) ×3 IMPLANT
GLOVE BIOGEL PI INDICATOR 6.5 (GLOVE) ×3
GLOVE BIOGEL PI INDICATOR 7.0 (GLOVE) ×3
GLOVE INDICATOR 8.0 STRL GRN (GLOVE) ×2 IMPLANT
GOWN STRL REUS W/TWL XL LVL3 (GOWN DISPOSABLE) ×8 IMPLANT
HOVERMATT SINGLE USE (MISCELLANEOUS) ×2 IMPLANT
KIT BASIN OR (CUSTOM PROCEDURE TRAY) ×2 IMPLANT
KIT GASTRIC LAVAGE 34FR ADT (SET/KITS/TRAYS/PACK) ×2 IMPLANT
LUBRICANT JELLY K Y 4OZ (MISCELLANEOUS) ×2 IMPLANT
MARKER SKIN DUAL TIP RULER LAB (MISCELLANEOUS) ×2 IMPLANT
NEEDLE SPNL 22GX3.5 QUINCKE BK (NEEDLE) ×2 IMPLANT
PACK CARDIOVASCULAR III (CUSTOM PROCEDURE TRAY) ×2 IMPLANT
RELOAD 45 VASCULAR/THIN (ENDOMECHANICALS) IMPLANT
RELOAD ENDO STITCH 2.0 (ENDOMECHANICALS) ×10
RELOAD STAPLE TA45 3.5 REG BLU (ENDOMECHANICALS) IMPLANT
RELOAD STAPLER BLUE 60MM (STAPLE) ×4 IMPLANT
RELOAD STAPLER GOLD 60MM (STAPLE) ×1 IMPLANT
RELOAD STAPLER WHITE 60MM (STAPLE) ×2 IMPLANT
SCISSORS LAP 5X45 EPIX DISP (ENDOMECHANICALS) ×2 IMPLANT
SET IRRIG TUBING LAPAROSCOPIC (IRRIGATION / IRRIGATOR) ×2 IMPLANT
SHEARS HARMONIC ACE PLUS 45CM (MISCELLANEOUS) ×2 IMPLANT
SLEEVE XCEL OPT CAN 5 100 (ENDOMECHANICALS) ×2 IMPLANT
SOLUTION ANTI FOG 6CC (MISCELLANEOUS) ×2 IMPLANT
STAPLER ECHELON BIOABSB 60 FLE (MISCELLANEOUS) IMPLANT
STAPLER ECHELON LONG 60 440 (INSTRUMENTS) ×2 IMPLANT
STAPLER RELOAD BLUE 60MM (STAPLE) ×8
STAPLER RELOAD GOLD 60MM (STAPLE) ×2
STAPLER RELOAD WHITE 60MM (STAPLE) ×4
STRIP CLOSURE SKIN 1/2X4 (GAUZE/BANDAGES/DRESSINGS) ×2 IMPLANT
SUT MNCRL AB 4-0 PS2 18 (SUTURE) ×2 IMPLANT
SUT RELOAD ENDO STITCH 2 48X1 (ENDOMECHANICALS) ×6
SUT RELOAD ENDO STITCH 2.0 (ENDOMECHANICALS) ×4
SUT SURGIDAC NAB ES-9 0 48 120 (SUTURE) IMPLANT
SUT VIC AB 2-0 SH 27 (SUTURE) ×1
SUT VIC AB 2-0 SH 27X BRD (SUTURE) ×1 IMPLANT
SUTURE RELOAD END STTCH 2 48X1 (ENDOMECHANICALS) ×6 IMPLANT
SUTURE RELOAD ENDO STITCH 2.0 (ENDOMECHANICALS) ×4 IMPLANT
SYR 10ML ECCENTRIC (SYRINGE) ×2 IMPLANT
SYR 20CC LL (SYRINGE) ×4 IMPLANT
TIP RIGID 35CM EVICEL (HEMOSTASIS) ×2 IMPLANT
TOWEL OR 17X26 10 PK STRL BLUE (TOWEL DISPOSABLE) ×2 IMPLANT
TOWEL OR NON WOVEN STRL DISP B (DISPOSABLE) ×2 IMPLANT
TRAY FOLEY W/BAG SLVR 14FR (SET/KITS/TRAYS/PACK) ×2 IMPLANT
TROCAR BLADELESS OPT 5 100 (ENDOMECHANICALS) ×2 IMPLANT
TROCAR UNIVERSAL OPT 12M 100M (ENDOMECHANICALS) ×6 IMPLANT
TROCAR XCEL 12X100 BLDLESS (ENDOMECHANICALS) ×2 IMPLANT
TUBING CONNECTING 10 (TUBING) ×4 IMPLANT
TUBING ENDO SMARTCAP PENTAX (MISCELLANEOUS) ×2 IMPLANT
TUBING INSUF HEATED (TUBING) ×2 IMPLANT

## 2017-11-25 NOTE — Op Note (Signed)
Marisa Anderson [id number] May 04, 1966. 11/25/2017  Preoperative diagnosis:    Morbid obesity BMI 45   Diabetes mellitus 2   Dyslipidemia  Hypertension   Postoperative  diagnosis:  1. same  Surgical procedure: Laparoscopic Roux-en-Y gastric bypass (ante-colic, ante-gastric); upper endoscopy  Surgeon: Atilano Ina, M.D. FACS  Asst.: Jaclynn Guarneri MD FACS  Anesthesia: General plus exparel  Complications: None   EBL: Minimal   Drains: None   Disposition: PACU in good condition   Indications for procedure: 52yo female with morbid obesity who has been unsuccessful at sustained weight loss. The patient's comorbidities are listed above. We discussed the risk and benefits of surgery including but not limited to anesthesia risk, bleeding, infection, blood clot formation, anastomotic leak, anastomotic stricture, ulcer formation, death, respiratory complications, intestinal blockage, internal hernia, gallstone formation, vitamin and nutritional deficiencies, injury to surrounding structures, failure to lose weight and mood changes.   Description of procedure: Patient is brought to the operating room and general anesthesia induced. The patient had received preoperative broad-spectrum IV antibiotics and subcutaneous heparin. The abdomen was widely sterilely prepped with Chloraprep and draped. Patient timeout was performed and correct patient and procedure confirmed. Access was obtained with a 12 mm Optiview trocar in the left upper quadrant and pneumoperitoneum established without difficulty. Under direct vision 12 mm trocars were placed laterally in the right upper quadrant, right upper quadrant midclavicular line, and to the left and above the umbilicus for the camera port. A 5 mm trocar was placed laterally in the left upper quadrant. Bilateral lateral abdominal wall TAP block was placed with exparel. The omentum was brought into the upper abdomen and the transverse mesocolon elevated and the  ligament of Treitz clearly identified. A 40 cm biliopancreatic limb was then carefully measured from the ligament of Treitz. The small intestine was divided at this point with a single firing of the white load linear stapler. A Penrose drain was sutured to the end of the Roux-en-Y limb for later identification. A 100 cm Roux-en-Y limb was then carefully measured. At this point a side-to-side anastomosis was created between the Roux limb and the end of the biliopancreatic limb. This was accomplished with a single firing of the 60mm white load linear stapler. The common enterotomy was closed with a running 2-0 Vicryl begun at either end of the enterotomy and tied centrally. Eviceal tissue sealant was placed over the anastomosis. The mesenteric defect was then closed with running 2-0 silk. The omentum was then divided with the harmonic scalpel up towards the transverse colon to allow mobility of the Roux limb toward the gastric pouch. The patient was then placed in steep reversed Trendelenburg. Through a 5 mm subxiphoid site the [facility] retractor was placed and the left lobe of the liver elevated with excellent exposure of the upper stomach and hiatus. The angle of Hiss was then mobilized with the harmonic scalpel. A 4 cm gastric pouch was then carefully measured along the lesser curve of the stomach. Dissection was carried along the lesser curve at this point with the Harmonic scalpel working carefully back toward the lesser sac at right angles to the lesser curve. The free lesser sac was then entered. After being sure all tubes were removed from the stomach an initial firing of the gold load 60 mm linear stapler was fired at right angles across the lesser curve for about 4 cm. The gastric pouch was further mobilized posteriorly and then the pouch was completed with 3 further firings of the 60 mm  blue load linear stapler  up through the previously dissected angle of His. It was ensured that the pouch was completely  mobilized away from the gastric remnant. This created a nice tubular 4-5 cm gastric pouch.  The Roux limb was then brought up in an antecolic fashion with the candycane facing to the patient's left without undue tension. The gastrojejunostomy was created with an initial posterior row of 2-0 Vicryl between the Roux limb and the staple line of the gastric pouch. Enterotomies were then made in the gastric pouch and the Roux limb with the harmonic scalpel and at approximately 2-2-1/2 cm anastomosis was created with a single firing of the 60mm blue load linear stapler. The staple line was inspected and was intact without bleeding. The common enterotomy was then closed with running 2-0 Vicryl begun at either end and tied centrally. The Ewall tube was then easily passed through the anastomosis and an outer anterior layer of running 2-0 Vicryl was placed. The Ewald tube was removed. With the outlet of the gastrojejunostomy clamped and under saline irrigation the assistant performed upper endoscopy and with the gastric pouch tensely distended with air-there was no evidence of leak on this test. The pouch was desufflated. The Vonita Moss defect was closed with running 2-0 silk. The abdomen was inspected for any evidence of bleeding or bowel injury and everything looked fine. The Nathanson retractor was removed under direct vision after coating the anastomosis with Eviceal tissue sealant. All CO2 was evacuated and trochars removed. Skin incisions were closed with 4-0 monocryl in a subcuticular fashion followed by Dermabond. Sponge needle and instrument counts were correct. The patient was taken to the PACU in good condition.    Mary Sella. [name], MD, FACS General, Bariatric, & Minimally Invasive Surgery [facility], [place]

## 2017-11-25 NOTE — Interval H&P Note (Signed)
History and Physical Interval Note:  11/25/2017 7:12 AM  Marisa Anderson  has presented today for surgery, with the diagnosis of MORBID OBESITY  The various methods of treatment have been discussed with the patient and family. After consideration of risks, benefits and other options for treatment, the patient has consented to  Procedure(s): LAPAROSCOPIC ROUX-EN-Y GASTRIC BYPASS WITH UPPER ENDOSCOPY (N/A) as a surgical intervention .  The patient's history has been reviewed, patient examined, no change in status, stable for surgery.  I have reviewed the patient's chart and labs.  Questions were answered to the patient's satisfaction.    Mary Sella. [name], MD, FACS General, Bariatric, & Minimally Invasive Surgery [facility] [place]  Gaynelle Adu

## 2017-11-25 NOTE — Plan of Care (Signed)
Plan of care discussed with patient and family 

## 2017-11-25 NOTE — Anesthesia Postprocedure Evaluation (Signed)
Anesthesia Post Note  Patient: Marisa Anderson  Procedure(s) Performed: LAPAROSCOPIC ROUX-EN-Y GASTRIC BYPASS WITH UPPER ENDOSCOPY (N/A Abdomen)     Patient location during evaluation: PACU Anesthesia Type: General Level of consciousness: sedated Pain management: pain level controlled Vital Signs Assessment: post-procedure vital signs reviewed and stable Respiratory status: spontaneous breathing and respiratory function stable Cardiovascular status: stable Postop Assessment: no apparent nausea or vomiting Anesthetic complications: no    Last Vitals:  Vitals:   11/25/17 1150 11/25/17 1200  BP: (!) 182/109 (!) 197/109  Pulse: 99 (!) 103  Resp: 10 16  Temp:  36.6 C  SpO2: 97% 99%    Last Pain:  Vitals:   11/25/17 1200  TempSrc:   PainSc: 3                  Jenniefer Salak DANIEL

## 2017-11-25 NOTE — Discharge Instructions (Signed)
° ° ° °GASTRIC BYPASS/SLEEVE ° Home Care Instructions ° ° These instructions are to help you care for yourself when you go home. ° °Call: If you have any problems. °• Call [phone] and ask for the surgeon on call °• If you need immediate help, come to the ER at [facility].  °• Tell the ER staff that you are a new post-op gastric bypass or gastric sleeve patient °  °Signs and symptoms to report: • Severe vomiting or nausea °o If you cannot keep down clear liquids for longer than 1 day, call your surgeon  °• Abdominal pain that does not get better after taking your pain medication °• Fever over 100.4° F with chills °• Heart beating over 100 beats a minute °• Shortness of breath at rest °• Chest pain °•  Redness, swelling, drainage, or foul odor at incision (surgical) sites °•  If your incisions open or pull apart °• Swelling or pain in calf (lower leg) °• Diarrhea (Loose bowel movements that happen often), frequent watery, uncontrolled bowel movements °• Constipation, (no bowel movements for 3 days) if this happens: Pick one °o Milk of Magnesia, 2 tablespoons by mouth, 3 times a day for 2 days if needed °o Stop taking Milk of Magnesia once you have a bowel movement °o Call your doctor if constipation continues °Or °o Miralax  (instead of Milk of Magnesia) following the label instructions °o Stop taking Miralax once you have a bowel movement °o Call your doctor if constipation continues °• Anything you think is not normal °  °Normal side effects after surgery: • Unable to sleep at night or unable to focus °• Irritability or moody °• Being tearful (crying) or depressed °These are common complaints, possibly related to your anesthesia medications that put you to sleep, stress of surgery, and change in lifestyle.  This usually goes away a few weeks after surgery.  If these feelings continue, call your primary care doctor. °  °Wound Care: You may have surgical glue, steri-strips, or staples over your incisions after  surgery °• Surgical glue:  Looks like a clear film over your incisions and will wear off a little at a time °• Steri-strips: Strips of tape over your incisions. You may notice a yellowish color on the skin under the steri-strips. This is used to make the   steri-strips stick better. Do not pull the steri-strips off - let them fall off °• Staples: Staples may be removed before you leave the hospital °o If you go home with staples, call [facility], [phone] at for an appointment with your surgeon’s nurse to have staples removed 10 days after surgery. °• Showering: You may shower two (2) days after your surgery unless your surgeon tells you differently °o Wash gently around incisions with warm soapy water, rinse well, and gently pat dry  °o No tub baths until staples are removed, steri-strips fall off or glue is gone.  °  °Medications: • Medications should be liquid or crushed if larger than the size of a dime °• Extended release pills (medication that release a little bit at a time through the day) should NOT be crushed or cut. (examples include XL, ER, DR, SR) °• Depending on the size and number of medications you take, you may need to space (take a few throughout the day)/change the time you take your medications so that you do not over-fill your pouch (smaller stomach) °• Make sure you follow-up with your primary care doctor to   make medication changes needed during rapid weight loss and life-style changes °• If you have diabetes, follow up with the doctor that orders your diabetes medication(s) within one week after surgery and check your blood sugar regularly. °• Do not drive while taking prescription pain medication  °• It is ok to take Tylenol by the bottle instructions with your pain medicine or instead of your pain medicine as needed.  DO NOT TAKE NSAIDS (EXAMPLES OF NSAIDS:  IBUPROFREN/ NAPROXEN)  °Diet:                    First 2 Weeks ° You will see the dietician t about two (2) weeks  after your surgery. The dietician will increase the types of foods you can eat if you are handling liquids well: °• If you have severe vomiting or nausea and cannot keep down clear liquids lasting longer than 1 day, call your surgeon @ ([phone]) °Protein Shake °• Drink at least 2 ounces of shake 5-6 times per day °• Each serving of protein shakes (usually 8 - 12 ounces) should have: °o 15 grams of protein  °o And no more than 5 grams of carbohydrate  °• Goal for protein each day: °o Men = 80 grams per day °o Women = 60 grams per day °• Protein powder may be added to fluids such as non-fat milk or Lactaid milk or unsweetened Soy/Almond milk (limit to 35 grams added protein powder per serving) ° °Hydration °• Slowly increase the amount of water and other clear liquids as tolerated (See Acceptable Fluids) °• Slowly increase the amount of protein shake as tolerated  °•  Sip fluids slowly and throughout the day.  Do not use straws. °• May use sugar substitutes in small amounts (no more than 6 - 8 packets per day; i.e. Splenda) ° °Fluid Goal °• The first goal is to drink at least 8 ounces of protein shake/drink per day (or as directed by the nutritionist); some examples of protein shakes are Syntrax Nectar, Adkins Advantage, EAS Edge HP, and Unjury. See handout from pre-op Bariatric Education Class: °o Slowly increase the amount of protein shake you drink as tolerated °o You may find it easier to slowly sip shakes throughout the day °o It is important to get your proteins in first °• Your fluid goal is to drink 64 - 100 ounces of fluid daily °o It may take a few weeks to build up to this °• 32 oz (or more) should be clear liquids  °And  °• 32 oz (or more) should be full liquids (see below for examples) °• Liquids should not contain sugar, caffeine, or carbonation ° °Clear Liquids: °• Water or Sugar-free flavored water (i.e. Fruit H2O, Propel) °• Decaffeinated coffee or tea (sugar-free) °• Crystal Lite, Wyler’s Lite,  Minute Maid Lite °• Sugar-free Jell-O °• Bouillon or broth °• Sugar-free Popsicle:   *Less than 20 calories each; Limit 1 per day ° °Full Liquids: °Protein Shakes/Drinks + 2 choices per day of other full liquids °• Full liquids must be: °o No More Than 15 grams of Carbs per serving  °o No More Than 3 grams of Fat per serving °• Strained low-fat cream soup (except Cream of Potato or Tomato) °• Non-Fat milk °• Fat-free Lactaid Milk °• Unsweetened Soy Or Unsweetened Almond Milk °• Low Sugar yogurt (Dannon Lite & Fit, Greek yogurt; Oikos Triple Zero; Chobani Simply 100; Yoplait 100 calorie Greek - No Fruit on the Bottom) ° °  °Vitamins   and Minerals • Start 1 day after surgery unless otherwise directed by your surgeon °• 2 Chewable Bariatric Specific Multivitamin / Multimineral Supplement with iron (Example: Bariatric Advantage Multi EA) °• Chewable Calcium with Vitamin D-3 °(Example: 3 Chewable Calcium Plus 600 with Vitamin D-3) °o Take 500 mg three (3) times a day for a total of 1500 mg each day °o Do not take all 3 doses of calcium at one time as it may cause constipation, and you can only absorb 500 mg  at a time  °o Do not mix multivitamins containing iron with calcium supplements; take 2 hours apart °• Menstruating women and those with a history of anemia (a blood disease that causes weakness) may need extra iron °o Talk with your doctor to see if you need more iron °• Do not stop taking or change any vitamins or minerals until you talk to your dietitian or surgeon °• Your Dietitian and/or surgeon must approve all vitamin and mineral supplements °  °Activity and Exercise: Limit your physical activity as instructed by your doctor.  It is important to continue walking at home.  During this time, use these guidelines: °• Do not lift anything greater than ten (10) pounds for at least two (2) weeks °• Do not go back to work or drive until your surgeon says you can °• You may have sex when you feel comfortable  °o It is  VERY important for female patients to use a reliable birth control method; fertility often increases after surgery  °o All hormonal birth control will be ineffective for 30 days after surgery due to medications given during surgery a barrier method must be used. °o Do not get pregnant for at least 18 months °• Start exercising as soon as your doctor tells you that you can °o Make sure your doctor approves any physical activity °• Start with a simple walking program °• Walk 5-15 minutes each day, 7 days per week.  °• Slowly increase until you are walking 30-45 minutes per day °Consider joining our BELT program. [phone] or email [email or web address] °  °Special Instructions Things to remember: °• Use your CPAP when sleeping if this applies to you ° °• [facility] has two free Bariatric Surgery Support Groups that meet monthly °o The 3rd Thursday of each month, 6 pm, [employer] [facility]  °o The 2nd Friday of each month, 11:45 am in the private dining room in the basement of [place] °• It is very important to keep all follow up appointments with your surgeon, dietitian, primary care physician, and behavioral health practitioner °• Routine follow up schedule with your surgeon include appointments at 2-3 weeks, 6-8 weeks, 6 months, and 1 year at a minimum.  Your surgeon may request to see you more often.   °o After the first year, please follow up with your bariatric surgeon and dietitian at least once a year in order to maintain best weight loss results °[facility]: [phone] °[facility]: [phone] °Bariatric Nurse Coordinator: [phone] °  °   Reviewed and Endorsed  °by [place] Patient Education Committee, June, 2016 °Edits Approved: Aug, 2018 ° ° ° °

## 2017-11-25 NOTE — Transfer of Care (Signed)
Immediate Anesthesia Transfer of Care Note  Patient: Marisa Anderson  Procedure(s) Performed: LAPAROSCOPIC ROUX-EN-Y GASTRIC BYPASS WITH UPPER ENDOSCOPY (N/A Abdomen)  Patient Location: PACU  Anesthesia Type:General  Level of Consciousness: sedated  Airway & Oxygen Therapy: Patient Spontanous Breathing and Patient connected to face mask oxygen  Post-op Assessment: Report given to RN and Post -op Vital signs reviewed and stable  Post vital signs: Reviewed and stable  Last Vitals:  Vitals Value Taken Time  BP    Temp    Pulse 83 11/25/2017 10:01 AM  Resp 12 11/25/2017 10:01 AM  SpO2 99 % 11/25/2017 10:01 AM  Vitals shown include unvalidated device data.  Last Pain:  Vitals:   11/25/17 0553  TempSrc:   PainSc: 0-No pain      Patients Stated Pain Goal: 4 (11/25/17 0553)  Complications: No apparent anesthesia complications

## 2017-11-25 NOTE — Progress Notes (Signed)
Discussed post op day goals with patient including ambulation, IS, diet progression, pain, and nausea control.  Questions answered. 

## 2017-11-25 NOTE — Anesthesia Procedure Notes (Signed)
Procedure Name: Intubation Date/Time: 11/25/2017 7:30 AM Performed by: Lind Covert, CRNA Pre-anesthesia Checklist: Patient identified, Emergency Drugs available, Suction available, Patient being monitored and Timeout performed Patient Re-evaluated:Patient Re-evaluated prior to induction Oxygen Delivery Method: Circle system utilized Preoxygenation: Pre-oxygenation with 100% oxygen Induction Type: IV induction Ventilation: Mask ventilation without difficulty Laryngoscope Size: Mac and 4 Grade View: Grade I Tube type: Oral Tube size: 7.0 mm Number of attempts: 1 Airway Equipment and Method: Stylet Placement Confirmation: ETT inserted through vocal cords under direct vision,  positive ETCO2 and breath sounds checked- equal and bilateral Secured at: 22 cm Tube secured with: Tape Dental Injury: Teeth and Oropharynx as per pre-operative assessment

## 2017-11-25 NOTE — Op Note (Signed)
Upper GI endoscopy is performed at the completion of laparoscopic Roux-en-Y gastric bypass by Dr. Wilson. The Olympus video endoscope was inserted into the upper esophagus and then passed under direct vision to the EG junction. The small gastric pouch was insufflated with air while the gastric outlet was clamped under irrigation by the operating surgeon. There was no evidence of leak. The anastomosis was visualized and was patent. Suture and staple lines were intact and without bleeding. The pouch was tubular and measured 4-5 cm in length. At the completion of the procedure the pouch was desufflated and the scope withdrawn. 

## 2017-11-26 ENCOUNTER — Encounter (HOSPITAL_COMMUNITY): Payer: Self-pay | Admitting: General Surgery

## 2017-11-26 LAB — GLUCOSE, CAPILLARY
GLUCOSE-CAPILLARY: 100 mg/dL — AB (ref 65–99)
GLUCOSE-CAPILLARY: 109 mg/dL — AB (ref 65–99)
GLUCOSE-CAPILLARY: 115 mg/dL — AB (ref 65–99)
GLUCOSE-CAPILLARY: 136 mg/dL — AB (ref 65–99)
Glucose-Capillary: 135 mg/dL — ABNORMAL HIGH (ref 65–99)
Glucose-Capillary: 123 mg/dL — ABNORMAL HIGH (ref 65–99)

## 2017-11-26 LAB — CBC WITH DIFFERENTIAL/PLATELET
BASOS PCT: 0 %
Basophils Absolute: 0 10*3/uL (ref 0.0–0.1)
EOS ABS: 0 10*3/uL (ref 0.0–0.7)
Eosinophils Relative: 0 %
HEMATOCRIT: 35.5 % — AB (ref 36.0–46.0)
Hemoglobin: 11.3 g/dL — ABNORMAL LOW (ref 12.0–15.0)
Lymphocytes Relative: 10 %
Lymphs Abs: 1 10*3/uL (ref 0.7–4.0)
MCH: 26.4 pg (ref 26.0–34.0)
MCHC: 31.8 g/dL (ref 30.0–36.0)
MCV: 82.9 fL (ref 78.0–100.0)
MONO ABS: 0.7 10*3/uL (ref 0.1–1.0)
MONOS PCT: 8 %
NEUTROS ABS: 8 10*3/uL — AB (ref 1.7–7.7)
Neutrophils Relative %: 82 %
Platelets: 278 10*3/uL (ref 150–400)
RBC: 4.28 MIL/uL (ref 3.87–5.11)
RDW: 20.5 % — ABNORMAL HIGH (ref 11.5–15.5)
WBC: 9.7 10*3/uL (ref 4.0–10.5)

## 2017-11-26 LAB — COMPREHENSIVE METABOLIC PANEL
ALBUMIN: 3.3 g/dL — AB (ref 3.5–5.0)
ALT: 39 U/L (ref 14–54)
ANION GAP: 9 (ref 5–15)
AST: 30 U/L (ref 15–41)
Alkaline Phosphatase: 79 U/L (ref 38–126)
BILIRUBIN TOTAL: 0.7 mg/dL (ref 0.3–1.2)
BUN: 13 mg/dL (ref 6–20)
CO2: 23 mmol/L (ref 22–32)
Calcium: 8.5 mg/dL — ABNORMAL LOW (ref 8.9–10.3)
Chloride: 105 mmol/L (ref 101–111)
Creatinine, Ser: 0.82 mg/dL (ref 0.44–1.00)
GFR calc Af Amer: >60 mL/min (ref >60)
GFR calc non Af Amer: >60 mL/min (ref >60)
GLUCOSE: 124 mg/dL — AB (ref 65–99)
POTASSIUM: 3.6 mmol/L (ref 3.5–5.1)
Sodium: 137 mmol/L (ref 135–145)
TOTAL PROTEIN: 6.9 g/dL (ref 6.5–8.1)

## 2017-11-26 NOTE — Plan of Care (Signed)

## 2017-11-26 NOTE — Progress Notes (Signed)
Patient alert and oriented, Post op day 1.  Provided support and encouragement.  Encouraged pulmonary toilet, ambulation and small sips of liquids.  Completed 12 ounces of bari clear fluid and started protein shakes.  All questions answered.  Will continue to monitor. 

## 2017-11-26 NOTE — Progress Notes (Signed)
Patient ID: Marisa Anderson, female   DOB: 03/27/1966, 52 y.o.   MRN: [id number]   Progress Note: Metabolic and Bariatric Surgery Service   Chief Complaint/Subjective: Walked some yesterday. Having some epigastric discomfort. Doing IS  Objective: Vital signs in last 24 hours: Temp:  [97.6 F (36.4 C)-98.7 F (37.1 C)] 97.9 F (36.6 C) (05/29 0525) Pulse Rate:  [70-103] 82 (05/29 0525) Resp:  [8-27] 14 (05/29 0525) BP: (119-200)/(73-122) 136/81 (05/29 0525) SpO2:  [95 %-100 %] 97 % (05/29 0525) Weight:  [117.8 kg (259 lb 11.2 oz)] 117.8 kg (259 lb 11.2 oz) (05/29 0525) Last BM Date: 11/24/17  Intake/Output from previous day: 05/28 0701 - 05/29 0700 In: 4025.8 [P.O.:380; I.V.:3545.8; IV Piggyback:100] Out: 3445 [Urine:3425; Blood:20] Intake/Output this shift: No intake/output data recorded.  Lungs: cta  Cardiovascular: reg  Abd: obese, soft, mild approp TTP, dressing c/d/i  Extremities: no edema. +SCDs  Neuro: nonfocal, alert  Lab Results: CBC  Recent Labs    11/25/17 1346 11/26/17 0433  WBC  --  9.7  HGB 13.8 11.3*  HCT 42.5 35.5*  PLT  --  278   BMET Recent Labs    11/26/17 0433  NA 137  K 3.6  CL 105  CO2 23  GLUCOSE 124*  BUN 13  CREATININE 0.82  CALCIUM 8.5*   PT/INR No results for input(s): LABPROT, INR in the last 72 hours. ABG No results for input(s): PHART, HCO3 in the last 72 hours.  Invalid input(s): PCO2, PO2  Studies/Results:  Anti-infectives: Anti-infectives (From admission, onward)   Start     Dose/Rate Route Frequency Ordered Stop   11/25/17 0628  sodium chloride 0.9 % with cefoTEtan (CEFOTAN) ADS Med    Note to Pharmacy:  Carmelia Roller   : cabinet override      11/25/17 0628 11/25/17 1844   11/25/17 0600  cefoTEtan in Dextrose 5% (CEFOTAN) IVPB 2 g     2 g Intravenous On call to O.R. 11/25/17 0540 11/25/17 0735      Medications: Scheduled Meds: . acetaminophen (TYLENOL) oral liquid 160 mg/5 mL  650 mg Oral Q6H  .  amLODipine  10 mg Oral Daily   And  . atorvastatin  40 mg Oral Daily  . enoxaparin (LOVENOX) injection  30 mg Subcutaneous Q12H  . gabapentin  200 mg Oral Q12H  . insulin aspart  0-20 Units Subcutaneous Q4H  . metoprolol succinate  50 mg Oral Daily  . protein supplement shake  2 oz Oral Q2H   Continuous Infusions: . 0.45 % NaCl with KCl 20 mEq / L 125 mL/hr at 11/26/17 0525  . famotidine (PEPCID) IV Stopped (11/25/17 2223)   PRN Meds:.albuterol, diphenhydrAMINE, enalaprilat, ipratropium, morphine injection, ondansetron (ZOFRAN) IV, oxyCODONE, promethazine, simethicone  Assessment/Plan: Patient Active Problem List   Diagnosis Date Noted  . Dyslipidemia 11/25/2017  . Murmur, cardiac 11/12/2016  . Preoperative cardiovascular examination 11/12/2016  . Morbid obesity (HCC) 11/12/2016  . Diabetes mellitus with coincident hypertension (HCC) 11/12/2016   s/p Procedure(s): LAPAROSCOPIC ROUX-EN-Y GASTRIC BYPASS WITH UPPER ENDOSCOPY 11/25/2017  Principal Problem:   Morbid obesity (HCC) Active Problems:   Diabetes mellitus with coincident hypertension (HCC)   Dyslipidemia  Doing well. No fever. No wbc.  Cont chemical vte prophylaxis Explained some upper abd discomfort is common Encouraged pt to sit upright when drinking Probably needs another night Disposition:  LOS: 1 day  The patient will be in the hospital for normal postop protocol  Gaynelle Adu, MD [phone] [facility]

## 2017-11-26 NOTE — Progress Notes (Addendum)
Patient alert and oriented, pain is controlled. Patient is tolerating fluids, advanced to protein shake today, patient is tolerating well. Reviewed Gastric Bypass discharge instructions with patient and patient is able to articulate understanding. Provided information on BELT program, Support Group and [facility] outpatient pharmacy. All questions answered, will continue to monitor.  Total 24 hour fluid recall 700 cc Per dehydration protocol call back one week post op

## 2017-11-27 LAB — CBC WITH DIFFERENTIAL/PLATELET
Basophils Absolute: 0 10*3/uL (ref 0.0–0.1)
Basophils Relative: 0 %
EOS PCT: 0 %
Eosinophils Absolute: 0 10*3/uL (ref 0.0–0.7)
HEMATOCRIT: 37.2 % (ref 36.0–46.0)
Hemoglobin: 11.7 g/dL — ABNORMAL LOW (ref 12.0–15.0)
LYMPHS PCT: 11 %
Lymphs Abs: 0.9 10*3/uL (ref 0.7–4.0)
MCH: 26.8 pg (ref 26.0–34.0)
MCHC: 31.5 g/dL (ref 30.0–36.0)
MCV: 85.1 fL (ref 78.0–100.0)
MONO ABS: 0.5 10*3/uL (ref 0.1–1.0)
MONOS PCT: 7 %
NEUTROS ABS: 6.7 10*3/uL (ref 1.7–7.7)
Neutrophils Relative %: 82 %
Platelets: 261 10*3/uL (ref 150–400)
RBC: 4.37 MIL/uL (ref 3.87–5.11)
RDW: 20.2 % — ABNORMAL HIGH (ref 11.5–15.5)
WBC: 8.1 10*3/uL (ref 4.0–10.5)

## 2017-11-27 LAB — GLUCOSE, CAPILLARY
GLUCOSE-CAPILLARY: 138 mg/dL — AB (ref 65–99)
Glucose-Capillary: 118 mg/dL — ABNORMAL HIGH (ref 65–99)

## 2017-11-27 IMAGING — CR DG CHEST 2V
2 series · 2 of 2 positions shown · non-contrast
Comparison: None in PACs

CLINICAL DATA: Preoperative clearance for gastric bypass procedure.
History of morbid obesity. No chest complaints. Nonsmoker. History
of diabetes, hypertension, and hyperlipidemia.

EXAM:
CHEST  2 VIEW

[w chest pa]
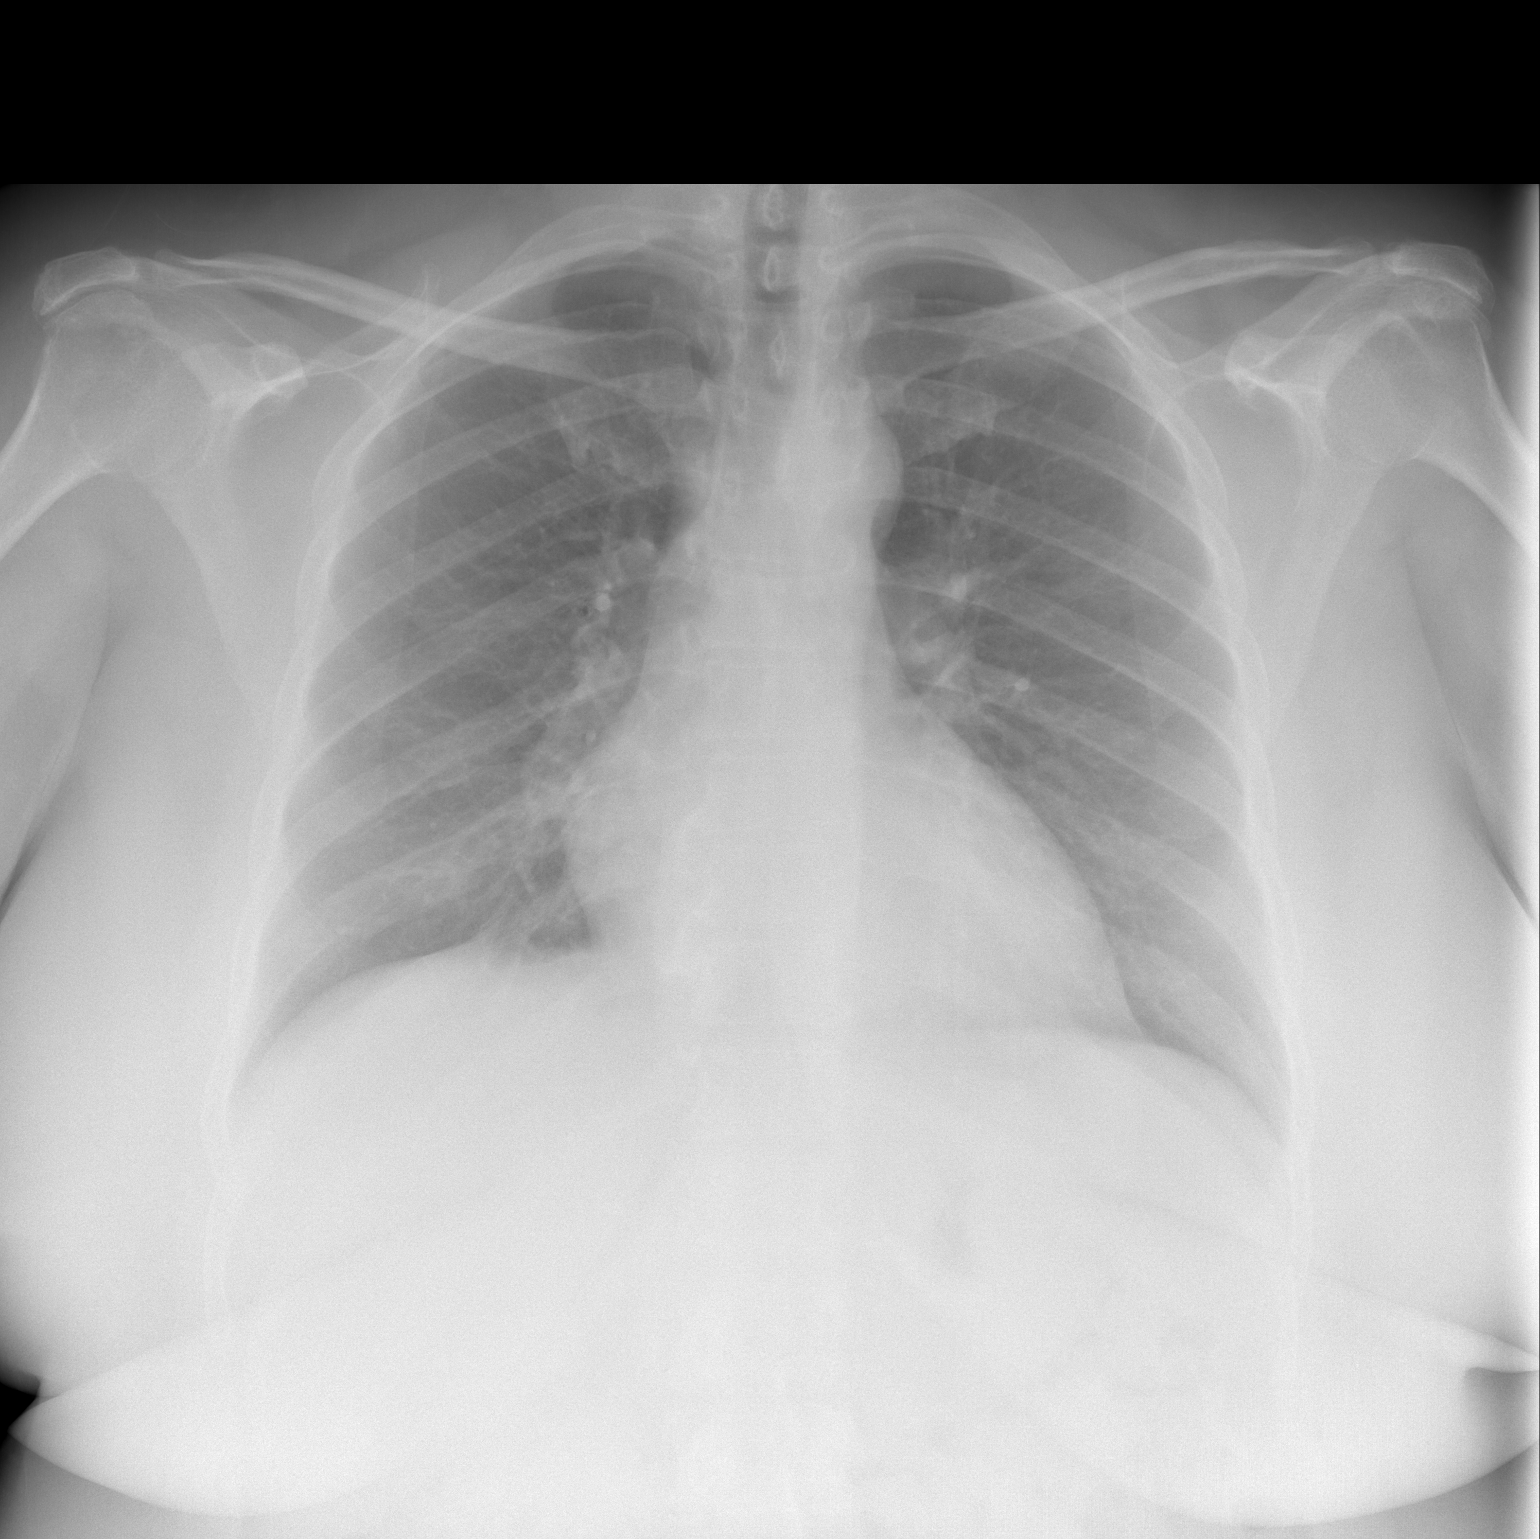

[w chest lat]
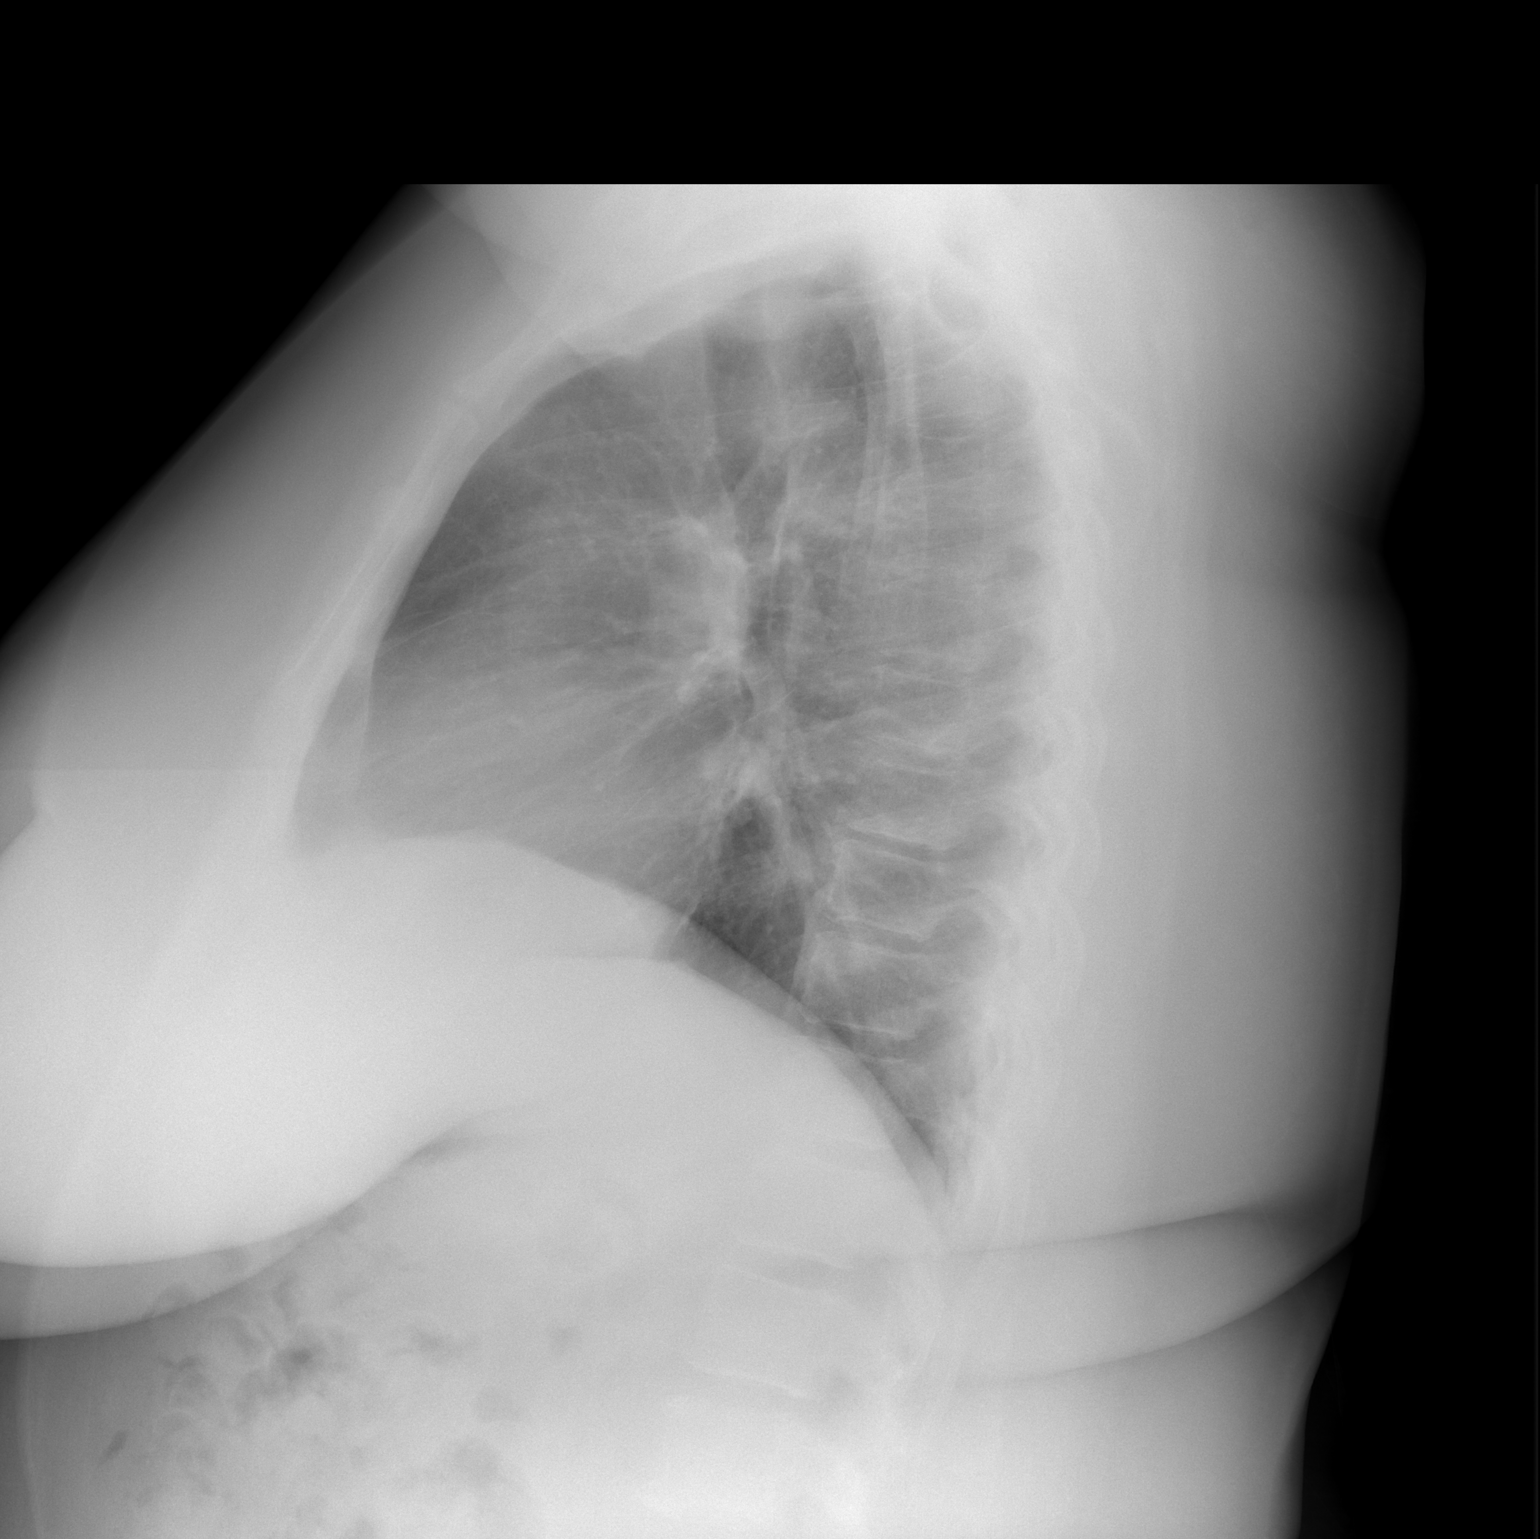

[2 of 2 positions shown; findings below may reference images not displayed]

FINDINGS: The lungs are adequately inflated and clear. The heart and pulmonary
vascularity are normal. The mediastinum is normal in width. There is
no pleural effusion. The bony thorax is unremarkable.
IMPRESSION: There is no active cardiopulmonary disease.

## 2017-11-27 MED ORDER — PANTOPRAZOLE SODIUM 40 MG PO TBEC: 40.0000 mg | DELAYED_RELEASE_TABLET | Freq: Every day | ORAL | 1 refills | Status: AC

## 2017-11-27 MED ORDER — METOPROLOL TARTRATE 25 MG PO TABS: 25.0000 mg | ORAL_TABLET | Freq: Two times a day (BID) | ORAL | 2 refills | Status: AC

## 2017-11-27 MED ORDER — OXYCODONE HCL 5 MG PO TABS: 5.0000 mg | ORAL_TABLET | Freq: Four times a day (QID) | ORAL | 0 refills | Status: AC | PRN

## 2017-11-27 MED ORDER — IRBESARTAN 300 MG PO TABS: 300.0000 mg | ORAL_TABLET | Freq: Every day | ORAL | Status: DC

## 2017-11-27 MED ORDER — ONDANSETRON HCL 4 MG PO TABS: 4.0000 mg | ORAL_TABLET | Freq: Three times a day (TID) | ORAL | 0 refills | Status: AC | PRN

## 2017-11-27 NOTE — Discharge Summary (Signed)
Physician Discharge Summary  Ethell Blatchford NWG:[id number] DOB: 11-09-1965 DOA: 11/25/2017  PCP: Moody Bruins, FNP  Admit date: 11/25/2017 Discharge date: 11/27/2017  Recommendations for Outpatient Follow-up:    Follow-up Information    Gaynelle Adu, MD. Go on 12/10/2017.   Specialty:  General Surgery Why:  at [address] information: [address] [place] [address] [phone]        Gaynelle Adu, MD .   Specialty:  General Surgery Contact information: [address] [place] [address] [phone]          Discharge Diagnoses:  Principal Problem:   Morbid obesity (HCC) Active Problems:   Diabetes mellitus with coincident hypertension (HCC)   Dyslipidemia   Surgical Procedure: Laparoscopic Roux-en-Y gastric bypass, upper endoscopy  Discharge Condition: Good Disposition: Home  Diet recommendation: Postoperative gastric bypass diet  Filed Weights   11/25/17 0532 11/26/17 0525  Weight: 116.9 kg (257 lb 12.8 oz) 117.8 kg (259 lb 11.2 oz)     Hospital Course:  The patient was admitted for a planned laparoscopic Roux-en-Y gastric bypass. Please see operative note. Preoperatively the patient was given 5000 units of subcutaneous heparin for DVT prophylaxis. ERAS protocol was used. Postoperative prophylactic Lovenox dosing was started on the evening of postoperative day 0.  The patient was started on ice chips and water on the evening of POD 0 which they tolerated. On postoperative day 1 The patient's diet was advanced to protein shakes which they also tolerated. On POD 2, The patient was ambulating without difficulty. Their vital signs are stable without fever or tachycardia. Their hemoglobin had remained stable.  The patient had received discharge instructions and counseling. They were deemed stable for discharge.  BP 140/81 (BP Location: Right Arm)   Pulse 74   Temp 99.7 F (37.6 C) (Oral)   Resp 14   Ht 5' 4.5" (1.638 m)    Wt 117.8 kg (259 lb 11.2 oz)   SpO2 96%   BMI 43.89 kg/m   Gen: alert, NAD, non-toxic appearing Pupils: equal, no scleral icterus Pulm: Lungs clear to auscultation, symmetric chest rise CV: regular rate and rhythm Abd: soft, min tender, nondistended. No cellulitis. No incisional hernia Ext: no edema, no calf tenderness Skin: no rash, no jaundice  Discharge Instructions  Discharge Instructions    Ambulate hourly while awake   Complete by:  As directed    Call MD for:  difficulty breathing, headache or visual disturbances   Complete by:  As directed    Call MD for:  persistant dizziness or light-headedness   Complete by:  As directed    Call MD for:  persistant nausea and vomiting   Complete by:  As directed    Call MD for:  redness, tenderness, or signs of infection (pain, swelling, redness, odor or green/yellow discharge around incision site)   Complete by:  As directed    Call MD for:  severe uncontrolled pain   Complete by:  As directed    Call MD for:  temperature >101 F   Complete by:  As directed    Diet bariatric full liquid   Complete by:  As directed    Discharge instructions   Complete by:  As directed    See bariatric discharge instructions   Incentive spirometry   Complete by:  As directed    Perform hourly while awake     Allergies as of 11/27/2017   No Known Allergies  Medication List    STOP taking these medications   ibuprofen 200 MG tablet Commonly known as:  ADVIL,MOTRIN   INVOKAMET XR [phone] MG Tb24 Generic drug:  Canagliflozin-metFORMIN HCl ER   metoprolol succinate 50 MG 24 hr tablet Commonly known as:  TOPROL-XL   pioglitazone 30 MG tablet Commonly known as:  ACTOS     TAKE these medications   albuterol 108 (90 Base) MCG/ACT inhaler Commonly known as:  PROVENTIL HFA;VENTOLIN HFA Inhale 2 puffs into the lungs every 6 (six) hours as needed for wheezing or shortness of breath.   albuterol (2.5 MG/3ML) 0.083% nebulizer  solution Commonly known as:  PROVENTIL Take 2.5 mg by nebulization every 6 (six) hours as needed for wheezing or shortness of breath.   amLODipine-atorvastatin 10-40 MG tablet Commonly known as:  CADUET Take 1 tablet by mouth daily. Notes to patient:  Monitor Blood Pressure Daily and keep a log for primary care physician.  You may need to make changes to your medications with rapid weight loss.     ergocalciferol 50000 units capsule Commonly known as:  VITAMIN D2 Take 50,000 Units by mouth 2 (two) times a week.   ferrous sulfate 325 (65 FE) MG tablet Take 325 mg by mouth daily with breakfast.   ipratropium 0.02 % nebulizer solution Commonly known as:  ATROVENT Take 0.5 mg by nebulization every 6 (six) hours as needed for wheezing or shortness of breath.   metoprolol tartrate 25 MG tablet Commonly known as:  LOPRESSOR Take 1 tablet (25 mg total) by mouth 2 (two) times daily.   ondansetron 4 MG tablet Commonly known as:  ZOFRAN Take 1 tablet (4 mg total) by mouth every 8 (eight) hours as needed for nausea or vomiting.   oxyCODONE 5 MG immediate release tablet Commonly known as:  Oxy IR/ROXICODONE Take 1 tablet (5 mg total) by mouth every 6 (six) hours as needed for breakthrough pain.   pantoprazole 40 MG tablet Commonly known as:  PROTONIX Take 1 tablet (40 mg total) by mouth daily.   valsartan 320 MG tablet Commonly known as:  DIOVAN Take 320 mg by mouth daily. Notes to patient:  Monitor Blood Pressure Daily and keep a log for primary care physician.  You may need to make changes to your medications with rapid weight loss.        Follow-up Information    Gaynelle Adu, MD. Go on 12/10/2017.   Specialty:  General Surgery Why:  at [address] information: [address] [place] [address] [phone]        Gaynelle Adu, MD .   Specialty:  General Surgery Contact information: [address] [place] [address] [phone]             The results of significant diagnostics from this hospitalization (including imaging, microbiology, ancillary and laboratory) are listed below for reference.    Significant Diagnostic Studies: No results found.  Labs: Basic Metabolic Panel: Recent Labs  Lab 11/20/17 1058 11/26/17 0433  NA 141 137  K 4.4 3.6  CL 105 105  CO2 25 23  GLUCOSE 154* 124*  BUN 18 13  CREATININE 1.05* 0.82  CALCIUM 9.3 8.5*   Liver Function Tests: Recent Labs  Lab 11/20/17 1058 11/26/17 0433  AST 18 30  ALT 18 39  ALKPHOS 92 79  BILITOT 0.5 0.7  PROT 7.5 6.9  ALBUMIN 3.5 3.3*    CBC: Recent Labs  Lab 11/20/17 1058 11/25/17 1346  11/26/17 0433 11/27/17 0511  WBC 5.3  --  9.7 8.1  NEUTROABS 3.6  --  8.0* 6.7  HGB 12.8 13.8 11.3* 11.7*  HCT 41.2 42.5 35.5* 37.2  MCV 84.9  --  82.9 85.1  PLT 299  --  278 261    CBG: Recent Labs  Lab 11/26/17 1526 11/26/17 1953 11/26/17 2349 11/27/17 0349 11/27/17 0744  GLUCAP 109* 123* 136* 118* 138*    Principal Problem:   Morbid obesity (HCC) Active Problems:   Diabetes mellitus with coincident hypertension (HCC)   Dyslipidemia   Time coordinating discharge: 15 min  Signed:  Atilano Ina, MD [facility], [place] [phone] 11/27/2017, 8:02 AM

## 2017-12-01 ENCOUNTER — Telehealth (HOSPITAL_COMMUNITY): Payer: Self-pay

## 2017-12-01 NOTE — Telephone Encounter (Addendum)
Patient called to discuss post bariatric surgery follow up questions.  See below:   1.  Tell me about your pain and pain management?having no pain  2.  Let's talk about fluid intake.  How much total fluid are you taking in?45+ ounces of fluid  3.  How much protein have you taken in the last 2 days?60 grams of protein  4.  Have you had nausea?  Tell me about when have experienced nausea and what you did to help?no nausea  5.  Has the frequency or color changed with your urine?urinated a lot light in color  6.  Tell me what your incisions look like?look good  7.  Have you been passing gas? BM?yes and had mulitple bms  8.  If a problem or question were to arise who would you call?  Do you know contact numbers for [facility], [facility], and [facility]?Yes  9.  How has the walking going?ambulating frequently  10.  How are your vitamins and calcium going?  How are you taking them?taking flintstones until opurity come in from [employer].  Instructed to take supplemental vitamins.  Tums for calcim  Follow up with primary care MD today.  No changes to discharge meds

## 2017-12-09 ENCOUNTER — Encounter: Payer: BLUE CROSS/BLUE SHIELD | Attending: General Surgery | Admitting: Registered"

## 2017-12-09 DIAGNOSIS — Z713 Dietary counseling and surveillance: Secondary | ICD-10-CM | POA: Diagnosis not present

## 2017-12-09 DIAGNOSIS — Z6841 Body Mass Index (BMI) 40.0 and over, adult: Secondary | ICD-10-CM | POA: Diagnosis not present

## 2017-12-09 DIAGNOSIS — E119 Type 2 diabetes mellitus without complications: Secondary | ICD-10-CM

## 2017-12-10 NOTE — Progress Notes (Signed)
Bariatric Class:  Appt start time: 1530 end time:  1630.  2 Week Post-Operative Nutrition Class  Patient was seen on 12/09/2017 for Post-Operative Nutrition [facility]   Surgery date: 11/25/2017 Surgery type: RYBG Start weight at [facility]: 257.9 Weight today: 243.8 Weight change: 14.1 lbs loss  Pt states she is consuming at least 64 ounces of fluid and 60 grams of protein. Pt states she checks BS 2x/day: 108-124. Pt reports recent A1c was 7.6 (4 months ago).   TANITA  BODY COMP RESULTS  12/09/2017   BMI (kg/m^2) 41.2   Fat Mass (lbs) 121.4   Fat Free Mass (lbs) 122.4   Total Body Water (lbs) 89.4   The following the learning objectives were met by the patient during this course:  Identifies Phase 3A (Soft, High Proteins) Dietary Goals and will begin from 2 weeks post-operatively to 2 months post-operatively  Identifies appropriate sources of fluids and proteins   States protein recommendations and appropriate sources post-operatively  Identifies the need for appropriate texture modifications, mastication, and bite sizes when consuming solids  Identifies appropriate multivitamin and calcium sources post-operatively  Describes the need for physical activity post-operatively and will follow MD recommendations  States when to call healthcare provider regarding medication questions or post-operative complications  Handouts given during class include:  Phase 3A: Soft, High Protein Diet Handout  Follow-Up Plan: Patient will follow-up at [facility] in 6 weeks for 2 month post-op nutrition visit for diet advancement per MD.

## 2017-12-12 ENCOUNTER — Telehealth: Payer: Self-pay | Admitting: Registered"

## 2017-12-12 NOTE — Telephone Encounter (Signed)
RD called pt to verify fluid intake once starting soft, solid proteins 2 week post-bariatric surgery.   Daily Fluid intake: 64+ ounces Daily Protein intake: 60 grams  Concerns/issues: none stated

## 2018-01-20 ENCOUNTER — Encounter: Payer: BLUE CROSS/BLUE SHIELD | Attending: General Surgery | Admitting: Registered"

## 2018-01-20 ENCOUNTER — Encounter: Payer: Self-pay | Admitting: Registered"

## 2018-01-20 DIAGNOSIS — E119 Type 2 diabetes mellitus without complications: Secondary | ICD-10-CM

## 2018-01-20 DIAGNOSIS — Z6841 Body Mass Index (BMI) 40.0 and over, adult: Secondary | ICD-10-CM | POA: Diagnosis not present

## 2018-01-20 DIAGNOSIS — Z713 Dietary counseling and surveillance: Secondary | ICD-10-CM | POA: Diagnosis present

## 2018-01-20 NOTE — Patient Instructions (Signed)
Goals:  Follow Phase 3B: High Protein + Non-Starchy Vegetables  Eat 3-6 small meals/snacks, every 3-5 hrs  Increase lean protein foods to meet 60g goal  Increase fluid intake to 64oz +  Avoid drinking 15 minutes before, during and 30 minutes after eating  Aim for >30 min of physical activity daily  

## 2018-01-20 NOTE — Progress Notes (Signed)
Follow-up visit:  8 Weeks Post-Operative RYGB Surgery  Medical Nutrition Therapy:  Appt start time: 9:40 end time:  10:05.  Primary concerns today: Post-operative Bariatric Surgery Nutrition Management.  Non scale victories: no longer taking diabetes medications  Surgery date: 11/25/2017 Surgery type: RYBG [facility]: 257.9 Weight today: 225.0 Weight change: 18.8 lbs loss from 243.8 (12/09/2017) Total weight lost: 32.9 lbs Weight loss goal: rid of type 2 diabetes, to get healthy, ~170 lbs  TANITA  BODY COMP RESULTS  12/09/2017 01/20/2018   BMI (kg/m^2) 41.2 38.0   Fat Mass (lbs) 121.4 101.2   Fat Free Mass (lbs) 122.4 123.8   Total Body Water (lbs) 89.4 89.6   Pt states she wants to get down to 170 lbs. Pt is doing well and glad to no longer be taking diabetes medications.   Preferred Learning Style:   No preference indicated   Learning Readiness:   Ready  Change in progress  24-hr recall: B (AM): protein shake (30g) Snk (AM): none  L (PM): greek yogurt (12-15g), sugar popsicle Snk (PM): none  D (PM): 2 oz baked fish (14g), 1/2 cup beans Snk (PM): 2 oz baked fish (14g), 1/2 cup beans  Fluid intake: water with flavor packs; 64+ oz Estimated total protein intake: 60+ grams  Medications: See list Supplementation: Opurity + Fe + Vitamin D + 3 TUMS  CBG monitoring: yes Average CBG per patient: 90-120 Last patient reported A1c: 7.4; next one is scheduled for 01/2018  Using straws: no Drinking while eating: no Having you been chewing well: yes Chewing/swallowing difficulties: no Changes in vision: no Changes to mood/headaches: no Hair loss/Changes to skin/Changes to nails: no, no, no Any difficulty focusing or concentrating: no Sweating: no Dizziness/Lightheaded: no Palpitations: no  Carbonated beverages: no N/V/D/C/GAS: no, no, no, no, no Abdominal Pain: no Dumping syndrome: no Last Lap-Band fill: N/A  Recent physical activity:  Walking 2 mi,  3-6 days/week  Progress Towards Goal(s):  In progress.  Handouts given during visit include:  Phase IV: High protein + NS vegetables   Nutritional Diagnosis:  [place]-3.3 Overweight/obesity related to past poor dietary habits and physical inactivity as evidenced by patient w/ recent RYGB surgery following dietary guidelines for continued weight loss.     Intervention:  Nutrition education and counseling. Pt was educated and counseled on the next phase of bariatric post-op diet. Pt was in agreement with goals listed.  Goals:  Follow Phase 3B: High Protein + Non-Starchy Vegetables  Eat 3-6 small meals/snacks, every 3-5 hrs  Increase lean protein foods to meet 60g goal  Increase fluid intake to 64oz +  Avoid drinking 15 minutes before, during and 30 minutes after eating  Aim for >30 min of physical activity daily  Teaching Method Utilized:  Visual Auditory Hands on  Barriers to learning/adherence to lifestyle change: none identified  Demonstrated degree of understanding via:  Teach Back   Monitoring/Evaluation:  Dietary intake, exercise, lap band fills, and body weight. Follow up in 4 months for 6 month post-op visit.

## 2018-05-12 ENCOUNTER — Ambulatory Visit: Payer: BLUE CROSS/BLUE SHIELD

## 2018-05-21 ENCOUNTER — Encounter: Payer: BLUE CROSS/BLUE SHIELD | Attending: General Surgery | Admitting: Registered"

## 2018-05-21 ENCOUNTER — Encounter: Payer: Self-pay | Admitting: Registered"

## 2018-05-21 DIAGNOSIS — Z713 Dietary counseling and surveillance: Secondary | ICD-10-CM | POA: Insufficient documentation

## 2018-05-21 DIAGNOSIS — E119 Type 2 diabetes mellitus without complications: Secondary | ICD-10-CM

## 2018-05-21 NOTE — Patient Instructions (Addendum)
-   Aim to increase vegetable intake.   - Increase physical activity to at least 2 days/week.

## 2018-05-21 NOTE — Progress Notes (Signed)
Follow-up visit:  6 Months Post-Operative RYGB Surgery  Medical Nutrition Therapy:  Appt start time: 9:00 end time:  9:30.  Primary concerns today: Post-operative Bariatric Surgery Nutrition Management.  Non scale victories: no longer taking diabetes medications, A1c has decreased to 5.6 from 7.4, no longer checking BS daily  Surgery date: 11/25/2017 Surgery type: RYBG [facility]: 257.9 Weight today: pt declined Weight change: N/A lbs loss from 225.0 (01/20/2018) Total weight lost: N/A Weight loss goal: rid of type 2 diabetes, to get healthy, ~170 lbs  TANITA  BODY COMP RESULTS  12/09/2017 01/20/2018 05/21/2018   BMI (kg/m^2) 41.2 38.0 Pt declined   Fat Mass (lbs) 121.4 101.2    Fat Free Mass (lbs) 122.4 123.8    Total Body Water (lbs) 89.4 89.6     Pt states she has 4 breaks at work and tries to eat something every time she takes a break. Pt states she wants to eat nuts. Pt states she is trying new food items: cauliflower potatoes, unsweetened pudding to name a few and enjoying them. Pt states she does not think she will ever add potatoes back into her regimen.   Pt states she wants to get down to 170 lbs. Pt is doing well and glad to no longer be taking diabetes medications.   Preferred Learning Style:   No preference indicated   Learning Readiness:   Ready  Change in progress  24-hr recall: B (AM): 3 oz baked chicken (21g) Snk (AM): sometimes salad  L (PM): greek yogurt (12-15g), sugar popsicle Snk (PM): none  D (PM): 3 oz baked fish/chicken (21g), 1/2 cup beans Snk (PM): 3 oz baked fish (21g), 1/2 cup beans  Fluid intake: water with flavor packs; 64+ oz Estimated total protein intake: 60+ grams  Medications: See list Supplementation: Opurity + Fe + Vitamin D + 3 TUMS  CBG monitoring: no Average CBG per patient: N/A Last patient reported A1c: 5.6  Using straws: no Drinking while eating: no Having you been chewing well: yes Chewing/swallowing  difficulties: no Changes in vision: no Changes to mood/headaches: no Hair loss/Changes to skin/Changes to nails: no, no, no Any difficulty focusing or concentrating: no Sweating: no Dizziness/Lightheaded: no Palpitations: no  Carbonated beverages: no N/V/D/C/GAS: no, no, no, no, no Abdominal Pain: no Dumping syndrome: no Last Lap-Band fill: N/A  Recent physical activity:  Walking, plans to join the [employer] for water aerobics  Progress Towards Goal(s):  In progress.  Handouts given during visit include:  Phase V: High protein + All vegetables   Nutritional Diagnosis:  [place]-3.3 Overweight/obesity related to past poor dietary habits and physical inactivity as evidenced by patient w/ recent RYGB surgery following dietary guidelines for continued weight loss.     Intervention:  Nutrition education and counseling. Pt was educated and counseled on the next phase of bariatric post-op diet. Pt was in agreement with goals listed.  Goals: - Aim to increase vegetable intake.  - Increase physical activity to at least 2 days/week.   Teaching Method Utilized:  Visual Auditory Hands on  Barriers to learning/adherence to lifestyle change: none identified  Demonstrated degree of understanding via:  Teach Back   Monitoring/Evaluation:  Dietary intake, exercise, lap band fills, and body weight. Follow up in 3 months for 9 month post-op visit.

## 2018-08-18 ENCOUNTER — Ambulatory Visit: Payer: BLUE CROSS/BLUE SHIELD | Admitting: Skilled Nursing Facility1

## 2022-06-14 ENCOUNTER — Encounter (HOSPITAL_COMMUNITY): Payer: Self-pay | Admitting: *Deleted

## 2023-06-19 ENCOUNTER — Encounter (HOSPITAL_COMMUNITY): Payer: Self-pay | Admitting: *Deleted

## 2024-09-09 ENCOUNTER — Ambulatory Visit: Payer: Self-pay | Admitting: Nurse Practitioner
